# Patient Record
Sex: Female | Born: 1990 | Race: Black or African American | Hispanic: No | Marital: Single | Smoking: Current every day smoker
Health system: Southern US, Community
[De-identification: ages and names within clinical notes are randomized; demographics above are authoritative.]

## PROBLEM LIST (undated history)

## (undated) DIAGNOSIS — D649 Anemia, unspecified: Secondary | ICD-10-CM

## (undated) HISTORY — DX: Anemia, unspecified: D64.9

## (undated) HISTORY — PX: NO PAST SURGERIES: SHX2092

---

## 2019-12-09 NOTE — L&D Delivery Note (Signed)
  Delivery Note Not long after epidural placed, pt felt pressure and was noted to be C/C/+3. After one push, at 10:15 PM a viable female was delivered via Vaginal, Spontaneous (Presentation: Left Occiput Anterior, loose nuchal cord, reduced).  APGAR: 8, 9; weight pending.  After 1 minute, the cord was clamped and cut. 40 units of pitocin diluted in 1000cc LR was infused rapidly IV.  The placenta separated spontaneously and delivered via CCT and maternal pushing effort.  It was inspected and appears to be intact with a 3 VC. Liletta IUD placed (see note).   Anesthesia: Epidural Episiotomy: None Lacerations: None Suture Repair:  Est. Blood Loss (mL): 242  Mom to postpartum.  Baby to Couplet care / Skin to Skin.  Jacklyn Shell 11/29/2020, 10:44 PM

## 2020-06-07 LAB — OB RESULTS CONSOLE HEPATITIS B SURFACE ANTIGEN: Hepatitis B Surface Ag: NEGATIVE

## 2020-06-07 LAB — OB RESULTS CONSOLE PLATELET COUNT: Platelets: 235

## 2020-06-07 LAB — OB RESULTS CONSOLE HGB/HCT, BLOOD
HCT: 32 (ref 29–41)
Hemoglobin: 10.2

## 2020-06-07 LAB — OB RESULTS CONSOLE RPR: RPR: NONREACTIVE

## 2020-06-07 LAB — OB RESULTS CONSOLE HIV ANTIBODY (ROUTINE TESTING): HIV: NONREACTIVE

## 2020-10-29 ENCOUNTER — Inpatient Hospital Stay (HOSPITAL_COMMUNITY)
Admission: AD | Admit: 2020-10-29 | Discharge: 2020-10-29 | Disposition: A | Discharge status: Home / Self Care | Payer: Medicaid Other | Attending: Obstetrics and Gynecology | Admitting: Obstetrics and Gynecology

## 2020-10-29 ENCOUNTER — Other Ambulatory Visit: Payer: Self-pay

## 2020-10-29 ENCOUNTER — Inpatient Hospital Stay (HOSPITAL_BASED_OUTPATIENT_CLINIC_OR_DEPARTMENT_OTHER): Payer: Medicaid Other

## 2020-10-29 ENCOUNTER — Encounter (HOSPITAL_COMMUNITY): Payer: Self-pay | Admitting: Obstetrics and Gynecology

## 2020-10-29 DIAGNOSIS — B373 Candidiasis of vulva and vagina: Secondary | ICD-10-CM | POA: Insufficient documentation

## 2020-10-29 DIAGNOSIS — O99013 Anemia complicating pregnancy, third trimester: Secondary | ICD-10-CM | POA: Diagnosis not present

## 2020-10-29 DIAGNOSIS — F17210 Nicotine dependence, cigarettes, uncomplicated: Secondary | ICD-10-CM | POA: Insufficient documentation

## 2020-10-29 DIAGNOSIS — R1032 Left lower quadrant pain: Secondary | ICD-10-CM | POA: Insufficient documentation

## 2020-10-29 DIAGNOSIS — O26893 Other specified pregnancy related conditions, third trimester: Secondary | ICD-10-CM | POA: Diagnosis not present

## 2020-10-29 DIAGNOSIS — R109 Unspecified abdominal pain: Secondary | ICD-10-CM

## 2020-10-29 DIAGNOSIS — Z3A33 33 weeks gestation of pregnancy: Secondary | ICD-10-CM | POA: Insufficient documentation

## 2020-10-29 DIAGNOSIS — O99333 Smoking (tobacco) complicating pregnancy, third trimester: Secondary | ICD-10-CM | POA: Insufficient documentation

## 2020-10-29 DIAGNOSIS — O479 False labor, unspecified: Secondary | ICD-10-CM

## 2020-10-29 DIAGNOSIS — O26892 Other specified pregnancy related conditions, second trimester: Secondary | ICD-10-CM | POA: Insufficient documentation

## 2020-10-29 DIAGNOSIS — Z3689 Encounter for other specified antenatal screening: Secondary | ICD-10-CM

## 2020-10-29 DIAGNOSIS — O98813 Other maternal infectious and parasitic diseases complicating pregnancy, third trimester: Secondary | ICD-10-CM | POA: Diagnosis not present

## 2020-10-29 DIAGNOSIS — O4703 False labor before 37 completed weeks of gestation, third trimester: Secondary | ICD-10-CM | POA: Insufficient documentation

## 2020-10-29 DIAGNOSIS — D649 Anemia, unspecified: Secondary | ICD-10-CM

## 2020-10-29 DIAGNOSIS — B3731 Acute candidiasis of vulva and vagina: Secondary | ICD-10-CM

## 2020-10-29 LAB — URINALYSIS, ROUTINE W REFLEX MICROSCOPIC
Bilirubin Urine: NEGATIVE
Glucose, UA: NEGATIVE mg/dL
Hgb urine dipstick: NEGATIVE
Ketones, ur: NEGATIVE mg/dL
Nitrite: NEGATIVE
Protein, ur: NEGATIVE mg/dL
Specific Gravity, Urine: 1.017 (ref 1.005–1.030)
pH: 6 (ref 5.0–8.0)

## 2020-10-29 LAB — CBC
HCT: 28.7 % — ABNORMAL LOW (ref 36.0–46.0)
Hemoglobin: 9.1 g/dL — ABNORMAL LOW (ref 12.0–15.0)
MCH: 26.9 pg (ref 26.0–34.0)
MCHC: 31.7 g/dL (ref 30.0–36.0)
MCV: 84.9 fL (ref 80.0–100.0)
Platelets: 193 10*3/uL (ref 150–400)
RBC: 3.38 MIL/uL — ABNORMAL LOW (ref 3.87–5.11)
RDW: 15.9 % — ABNORMAL HIGH (ref 11.5–15.5)
WBC: 5.5 10*3/uL (ref 4.0–10.5)
nRBC: 0 % (ref 0.0–0.2)

## 2020-10-29 LAB — WET PREP, GENITAL
Clue Cells Wet Prep HPF POC: NONE SEEN
Sperm: NONE SEEN
Trich, Wet Prep: NONE SEEN

## 2020-10-29 MED ORDER — TERCONAZOLE 0.4 % VA CREA: 1.0000 | TOPICAL_CREAM | Freq: Every day | VAGINAL | 0 refills | Status: DC

## 2020-10-29 MED ORDER — POLYSACCHARIDE IRON COMPLEX 150 MG PO CAPS: 150.0000 mg | ORAL_CAPSULE | Freq: Every day | ORAL | 0 refills | Status: DC

## 2020-10-29 NOTE — MAU Provider Note (Addendum)
History     CSN: [id number]  Arrival date and time: 10/29/20 1146   First Provider Initiated Contact with Patient 10/29/20 1258      Chief Complaint  Patient presents with   Abdominal Pain   29 y.o. [id number] '@33' .0 wks presenting with LLQ pain. Sx started last night. Describes as sharp and intermittent. Rates pain 5/10. Sitting up helps at times. She has not taken anything for it. Denies urinary sx. Reports yellow, clumpy vaginal discharge with itching. No malodor. She used external Monistat which helped temporarily. She was getting care in [place] until August, none since she moved here. Denies any pregnancy complications. Reports good FM.   OB History    Gravida  7   Para  6   Term  6   Preterm      AB      Living  6     SAB      TAB      Ectopic      Multiple      Live Births  6           History reviewed. No pertinent past medical history.  Past Surgical History:  Procedure Laterality Date   NO PAST SURGERIES      Family History  Adopted: Yes  Problem Relation Age of Onset   Cancer Father     Social History   Tobacco Use   Smoking status: Current Every Day Smoker    Packs/day: 0.25    Types: Cigarettes   Smokeless tobacco: Never Used  Vaping Use   Vaping Use: Never used  Substance Use Topics   Alcohol use: Never   Drug use: Never    Allergies:  Allergies  Allergen Reactions   Aspirin Swelling   Tylenol [Acetaminophen] Swelling    Medications Prior to Admission  Medication Sig Dispense Refill Last Dose   miconazole (MONISTAT 1 COMBINATION PACK) kit Place 1 each vaginally once.   Past Month at Unknown time   Prenatal Vit-Fe Fumarate-FA (MULTIVITAMIN-PRENATAL) 27-0.8 MG TABS tablet Take 1 tablet by mouth daily at 12 noon.   10/28/2020 at Unknown time    Review of Systems  Constitutional: Negative for chills and fever.  Gastrointestinal: Positive for abdominal pain. Negative for nausea and vomiting.  Genitourinary:  Positive for vaginal discharge. Negative for dysuria and vaginal bleeding.  Musculoskeletal: Negative for back pain.   Physical Exam   Blood pressure (!) 117/57, pulse 70, temperature 98.6 F (37 C), temperature source Oral, resp. rate 18, height '5\' 11"'  (1.803 m), weight 71.8 kg, SpO2 100 %.  Physical Exam Vitals and nursing note reviewed. Exam conducted with a chaperone present.  Constitutional:      General: She is not in acute distress.    Appearance: Normal appearance.  HENT:     Head: Normocephalic and atraumatic.  Cardiovascular:     Rate and Rhythm: Normal rate.  Pulmonary:     Effort: Pulmonary effort is normal. No respiratory distress.  Abdominal:     Palpations: Abdomen is soft.     Tenderness: There is no abdominal tenderness.    Genitourinary:    Comments: VE: closed/thick Musculoskeletal:        General: Normal range of motion.     Cervical back: Normal range of motion.  Skin:    General: Skin is warm and dry.  Neurological:     General: No focal deficit present.     Mental Status: She is alert and oriented to person,  place, and time.  Psychiatric:        Mood and Affect: Mood normal.        Behavior: Behavior normal.   EFM: 150 bpm, mod variability, + accels, no decels Toco: rare, UI  Results for orders placed or performed during the hospital encounter of 10/29/20 (from the past 24 hour(s))  Urinalysis, Routine w reflex microscopic Urine, Clean Catch     Status: Abnormal   Collection Time: 10/29/20 12:10 PM  Result Value Ref Range   Color, Urine YELLOW YELLOW   APPearance CLOUDY (A) CLEAR   Specific Gravity, Urine 1.017 1.005 - 1.030   pH 6.0 5.0 - 8.0   Glucose, UA NEGATIVE NEGATIVE mg/dL   Hgb urine dipstick NEGATIVE NEGATIVE   Bilirubin Urine NEGATIVE NEGATIVE   Ketones, ur NEGATIVE NEGATIVE mg/dL   Protein, ur NEGATIVE NEGATIVE mg/dL   Nitrite NEGATIVE NEGATIVE   Leukocytes,Ua LARGE (A) NEGATIVE   RBC / HPF 0-5 0 - 5 RBC/hpf   WBC, UA 6-10 0  - 5 WBC/hpf   Bacteria, UA RARE (A) NONE SEEN   Squamous Epithelial / LPF 21-50 0 - 5   Mucus PRESENT    Hyphae Yeast PRESENT   Wet prep, genital     Status: Abnormal   Collection Time: 10/29/20  1:15 PM   Specimen: Vaginal  Result Value Ref Range   Yeast Wet Prep HPF POC PRESENT (A) NONE SEEN   Trich, Wet Prep NONE SEEN NONE SEEN   Clue Cells Wet Prep HPF POC NONE SEEN NONE SEEN   WBC, Wet Prep HPF POC MANY (A) NONE SEEN   Sperm NONE SEEN   CBC     Status: Abnormal   Collection Time: 10/29/20  1:25 PM  Result Value Ref Range   WBC 5.5 4.0 - 10.5 K/uL   RBC 3.38 (L) 3.87 - 5.11 MIL/uL   Hemoglobin 9.1 (L) 12.0 - 15.0 g/dL   HCT 28.7 (L) 36 - 46 %   MCV 84.9 80.0 - 100.0 fL   MCH 26.9 26.0 - 34.0 pg   MCHC 31.7 30.0 - 36.0 g/dL   RDW 15.9 (H) 11.5 - 15.5 %   Platelets 193 150 - 400 K/uL   nRBC 0.0 0.0 - 0.2 %   MAU Course  Procedures Limited US  MDM Labs ordered and reviewed. Normal limited US. No signs of PTL. Pain is likely MSK or BH ctx. Will treat yeast infection and start Fe for anemia. Plan to arrange NOB first available appt at [facility]. Stable for discharge home.   Assessment and Plan   1. [redacted] weeks gestation of pregnancy   2. Abdominal pain affecting pregnancy   3. NST (non-stress test) reactive   4. Braxton Hicks contractions   5. Anemia during pregnancy in third trimester   6. Yeast vaginitis    Discharge home Follow up at [facility] to start care PTL precautions Rx Terazol Rx Ferrex  Allergies as of 10/29/2020      Reactions   Aspirin Swelling   Tylenol [acetaminophen] Swelling      Medication List    STOP taking these medications   miconazole kit Commonly known as: MONISTAT 1 COMBINATION PACK     TAKE these medications   iron polysaccharides 150 MG capsule Commonly known as: NIFEREX Take 1 capsule (150 mg total) by mouth daily.   multivitamin-prenatal 27-0.8 MG Tabs tablet Take 1 tablet by mouth daily at 12 noon.   terconazole 0.4 %  vaginal cream  Commonly known as: TERAZOL 7 Place 1 applicator vaginally at bedtime.      Julianne Handler, CNM 10/29/2020, 2:42 PM

## 2020-10-29 NOTE — MAU Note (Signed)
Presents with c/o abdominal pain since last night.  Reports pain starts on her left side and shoots around her abdomen.  Denies VB or LOF.  Endorses +FM.  Reports last time seen for [facility] in [name]  was in August.  States EDD 12/17/2020.

## 2020-10-29 NOTE — Discharge Instructions (Signed)
Vaginal Yeast Infection, Adult  Vaginal yeast infection is a condition that causes vaginal discharge as well as soreness, swelling, and redness (inflammation) of the vagina. This is a common condition. Some women get this infection frequently. What are the causes? This condition is caused by a change in the normal balance of the yeast (candida) and bacteria that live in the vagina. This change causes an overgrowth of yeast, which causes the inflammation. What increases the risk? The condition is more likely to develop in women who:  Take antibiotic medicines.  Have diabetes.  Take birth control pills.  Are pregnant.  Douche often.  Have a weak body defense system (immune system).  Have been taking steroid medicines for a long time.  Frequently wear tight clothing. What are the signs or symptoms? Symptoms of this condition include:  White, thick, creamy vaginal discharge.  Swelling, itching, redness, and irritation of the vagina. The lips of the vagina (vulva) may be affected as well.  Pain or a burning feeling while urinating.  Pain during sex. How is this diagnosed? This condition is diagnosed based on:  Your medical history.  A physical exam.  A pelvic exam. Your health care provider will examine a sample of your vaginal discharge under a microscope. Your health care provider may send this sample for testing to confirm the diagnosis. How is this treated? This condition is treated with medicine. Medicines may be over-the-counter or prescription. You may be told to use one or more of the following:  Medicine that is taken by mouth (orally).  Medicine that is applied as a cream (topically).  Medicine that is inserted directly into the vagina (suppository). Follow these instructions at home:  Lifestyle  Do not have sex until your health care provider approves. Tell your sex partner that you have a yeast infection. That person should go to his or her health care  provider and ask if they should also be treated.  Do not wear tight clothes, such as pantyhose or tight pants.  Wear breathable cotton underwear. General instructions  Take or apply over-the-counter and prescription medicines only as told by your health care provider.  Eat more yogurt. This may help to keep your yeast infection from returning.  Do not use tampons until your health care provider approves.  Try taking a sitz bath to help with discomfort. This is a warm water bath that is taken while you are sitting down. The water should only come up to your hips and should cover your buttocks. Do this 3-4 times per day or as told by your health care provider.  Do not douche.  If you have diabetes, keep your blood sugar levels under control.  Keep all follow-up visits as told by your health care provider. This is important. Contact a health care provider if:  You have a fever.  Your symptoms go away and then return.  Your symptoms do not get better with treatment.  Your symptoms get worse.  You have new symptoms.  You develop blisters in or around your vagina.  You have blood coming from your vagina and it is not your menstrual period.  You develop pain in your abdomen. Summary  Vaginal yeast infection is a condition that causes discharge as well as soreness, swelling, and redness (inflammation) of the vagina.  This condition is treated with medicine. Medicines may be over-the-counter or prescription.  Take or apply over-the-counter and prescription medicines only as told by your health care provider.  Do not douche.   Do not have sex or use tampons until your health care provider approves.  Contact a health care provider if your symptoms do not get better with treatment or your symptoms go away and then return. This information is not intended to replace advice given to you by your health care provider. Make sure you discuss any questions you have with your health care  provider. Document Revised: 06/24/2019 Document Reviewed: 04/12/2018 Elsevier Patient Education  2020 [employer].   Pregnancy and Anemia  Anemia is a condition in which the concentration of red blood cells, or hemoglobin, in the blood is below normal. Hemoglobin is a substance in red blood cells that carries oxygen to the tissues of the body. Anemia results when enough oxygen does not reach these tissues. Anemia is common during pregnancy because the woman's body needs more blood volume and blood cells to provide nutrition to the fetus. The fetus needs iron and folic acid as it is developing. Your body may not produce enough red blood cells because of this. Also, during pregnancy, the liquid part of the blood (plasma) increases by about 30-50%, and the red blood cells increase by only 20%. This lowers the concentration of the red blood cells and creates a natural anemia-like situation. What are the causes? The most common cause of anemia during pregnancy is not having enough iron in the body to make red blood cells (iron deficiency anemia). Other causes may include:  Folic acid deficiency.  Vitamin B12 deficiency.  Certain prescription or over-the-counter medicines.  Certain medical conditions or infections that destroy red blood cells.  A low platelet count and bleeding caused by antibodies that go through the placenta to the fetus from the mother's blood. What are the signs or symptoms? Mild anemia may not be noticeable. If it becomes severe, symptoms may include:  Feeling tired (fatigue).  Shortness of breath, especially during activity.  Weakness.  Fainting.  Pale looking skin.  Headaches.  A fast or irregular heartbeat (palpitations).  Dizziness. How is this diagnosed? This condition may be diagnosed based on:  Your medical history and a physical exam.  Blood tests. How is this treated? Treatment for anemia during pregnancy depends on the cause of the anemia.  Treatment can include:  Dietary changes.  Supplements of iron, vitamin B12, or folic acid.  A blood transfusion. This may be needed if anemia is severe.  Hospitalization. This may be needed if there is a lot of blood loss or severe anemia. Follow these instructions at home:  Follow recommendations from your dietitian or health care provider about changing your diet.  Increase your vitamin C intake. This will help the stomach absorb more iron. Some foods that are high in vitamin C include: ? Oranges. ? Peppers. ? Tomatoes. ? Mangoes.  Eat a diet rich in iron. This would include foods such as: ? Liver. ? Beef. ? Eggs. ? Whole grains. ? Spinach. ? Dried fruit.  Take iron and vitamins as told by your health care provider.  Eat green leafy vegetables. These are a good source of folic acid.  Keep all follow-up visits as told by your health care provider. This is important. Contact a health care provider if:  You have frequent or lasting headaches.  You look pale.  You bruise easily. Get help right away if:  You have extreme weakness, shortness of breath, or chest pain.  You become dizzy or have trouble concentrating.  You have heavy vaginal bleeding.  You develop a rash.  You have bloody or black, tarry stools.  You faint.  You vomit up blood.  You vomit repeatedly.  You have abdominal pain.  You have a fever.  You are dehydrated. Summary  Anemia is a condition in which the concentration of red blood cells or hemoglobin in the blood is below normal.  Anemia is common during pregnancy because the woman's body needs more blood volume and blood cells to provide nutrition to the fetus.  The most common cause of anemia during pregnancy is not having enough iron in the body to make red blood cells (iron deficiency anemia).  Mild anemia may not be noticeable. If it becomes severe, symptoms may include feeling tired and weak. This information is not intended  to replace advice given to you by your health care provider. Make sure you discuss any questions you have with your health care provider. Document Revised: 05/10/2019 Document Reviewed: 12/30/2016 Elsevier Patient Education  2020 [employer] Inc[employer]   [employer] of the uterus can occur throughout pregnancy, but they are not always a sign that you are in labor. You may have practice contractions called Braxton Hicks contractions. These false labor contractions are sometimes confused with true labor. What are Deberah Pelton contractions? Braxton Hicks contractions are tightening movements that occur in the muscles of the uterus before labor. Unlike true labor contractions, these contractions do not result in opening (dilation) and thinning of the cervix. Toward the end of pregnancy (32-34 weeks), Braxton Hicks contractions can happen more often and may become stronger. These contractions are sometimes difficult to tell apart from true labor because they can be very uncomfortable. You should not feel embarrassed if you go to the hospital with false labor. Sometimes, the only way to tell if you are in true labor is for your health care provider to look for changes in the cervix. The health care provider will do a physical exam and may monitor your contractions. If you are not in true labor, the exam should show that your cervix is not dilating and your water has not broken. If there are no other health problems associated with your pregnancy, it is completely safe for you to be sent home with false labor. You may continue to have Braxton Hicks contractions until you go into true labor. How to tell the difference between true labor and false labor True labor  Contractions last 30-70 seconds.  Contractions become very regular.  Discomfort is usually felt in the top of the uterus, and it spreads to the lower abdomen and low back.  Contractions do not go away with  walking.  Contractions usually become more intense and increase in frequency.  The cervix dilates and gets thinner. False labor  Contractions are usually shorter and not as strong as true labor contractions.  Contractions are usually irregular.  Contractions are often felt in the front of the lower abdomen and in the groin.  Contractions may go away when you walk around or change positions while lying down.  Contractions get weaker and are shorter-lasting as time goes on.  The cervix usually does not dilate or become thin. Follow these instructions at home:   Take over-the-counter and prescription medicines only as told by your health care provider.  Keep up with your usual exercises and follow other instructions from your health care provider.  Eat and drink lightly if you think you are going into labor.  If Braxton Hicks contractions are making you uncomfortable: ? Change your position from  lying down or resting to walking, or change from walking to resting. ? Sit and rest in a tub of warm water. ? Drink enough fluid to keep your urine pale yellow. Dehydration may cause these contractions. ? Do slow and deep breathing several times an hour.  Keep all follow-up prenatal visits as told by your health care provider. This is important. Contact a health care provider if:  You have a fever.  You have continuous pain in your abdomen. Get help right away if:  Your contractions become stronger, more regular, and closer together.  You have fluid leaking or gushing from your vagina.  You pass blood-tinged mucus (bloody show).  You have bleeding from your vagina.  You have low back pain that you never had before.  You feel your baby's head pushing down and causing pelvic pressure.  Your baby is not moving inside you as much as it used to. Summary  Contractions that occur before labor are called Braxton Hicks contractions, false labor, or practice contractions.  Braxton  Hicks contractions are usually shorter, weaker, farther apart, and less regular than true labor contractions. True labor contractions usually become progressively stronger and regular, and they become more frequent.  Manage discomfort from [facility] contractions by changing position, resting in a warm bath, drinking plenty of water, or practicing deep breathing. This information is not intended to replace advice given to you by your health care provider. Make sure you discuss any questions you have with your health care provider. Document Revised: 11/06/2017 Document Reviewed: 04/09/2017 Elsevier Patient Education  2020 [employer].

## 2020-10-30 LAB — GC/CHLAMYDIA PROBE AMP (~~LOC~~) NOT AT ARMC
Chlamydia: NEGATIVE
Comment: NEGATIVE
Comment: NORMAL
Neisseria Gonorrhea: NEGATIVE

## 2020-10-30 LAB — CULTURE, OB URINE: Culture: <10000 — AB

## 2020-11-14 ENCOUNTER — Ambulatory Visit (INDEPENDENT_AMBULATORY_CARE_PROVIDER_SITE_OTHER): Payer: Medicaid Other | Admitting: Obstetrics

## 2020-11-14 ENCOUNTER — Encounter: Payer: Self-pay | Admitting: Obstetrics

## 2020-11-14 ENCOUNTER — Other Ambulatory Visit: Payer: Self-pay

## 2020-11-14 VITALS — BP 108/65 | HR 98 | Wt 165.0 lb

## 2020-11-14 DIAGNOSIS — Z3480 Encounter for supervision of other normal pregnancy, unspecified trimester: Secondary | ICD-10-CM | POA: Diagnosis not present

## 2020-11-14 DIAGNOSIS — O0930 Supervision of pregnancy with insufficient antenatal care, unspecified trimester: Secondary | ICD-10-CM | POA: Diagnosis not present

## 2020-11-14 DIAGNOSIS — Z3483 Encounter for supervision of other normal pregnancy, third trimester: Secondary | ICD-10-CM | POA: Diagnosis not present

## 2020-11-14 DIAGNOSIS — D508 Other iron deficiency anemias: Secondary | ICD-10-CM | POA: Diagnosis not present

## 2020-11-14 DIAGNOSIS — Z3A35 35 weeks gestation of pregnancy: Secondary | ICD-10-CM | POA: Diagnosis not present

## 2020-11-14 MED ORDER — FERROUS SULFATE 325 (65 FE) MG PO TABS: 325.0000 mg | ORAL_TABLET | Freq: Two times a day (BID) | ORAL | 5 refills | Status: DC

## 2020-11-14 NOTE — Progress Notes (Signed)
Subjective:    Sherry Woodward is being seen today for her first obstetrical visit.  This is not a planned pregnancy. She is at [redacted]w[redacted]d gestation. Her obstetrical history is significant for smoker. Relationship with FOB: significant other, not living together. Patient does intend to breast feed. Pregnancy history fully reviewed.  The information documented in the HPI was reviewed and verified.  Menstrual History: OB History    Gravida  7   Para  6   Term  6   Preterm      AB      Living  6     SAB      TAB      Ectopic      Multiple      Live Births  6            No LMP recorded. Patient is pregnant.    History reviewed. No pertinent past medical history.  Past Surgical History:  Procedure Laterality Date  . NO PAST SURGERIES      (Not in a hospital admission)  Allergies  Allergen Reactions  . Aspirin Swelling  . Tylenol [Acetaminophen] Swelling    Social History   Tobacco Use  . Smoking status: Current Every Day Smoker    Packs/day: 0.25    Types: Cigarettes  . Smokeless tobacco: Never Used  . Tobacco comment: 6 cigs/day  Substance Use Topics  . Alcohol use: Never    Family History  Adopted: Yes  Problem Relation Age of Onset  . Cancer Father      Review of Systems Constitutional: negative for weight loss Gastrointestinal: negative for vomiting Genitourinary:negative for genital lesions and vaginal discharge and dysuria Musculoskeletal:negative for back pain Behavioral/Psych: negative for abusive relationship, depression, illegal drug usage and tobacco use    Objective:    BP 108/65   Pulse 98   Wt 165 lb (74.8 kg)   BMI 23.01 kg/m  General Appearance:    Alert, cooperative, no distress, appears stated age  Head:    Normocephalic, without obvious abnormality, atraumatic  Eyes:    PERRL, conjunctiva/corneas clear, EOM's intact, fundi    benign, both eyes  Ears:    Normal TM's and external ear canals, both ears  Nose:    Nares normal, septum midline, mucosa normal, no drainage    or sinus tenderness  Throat:   Lips, mucosa, and tongue normal; teeth and gums normal  Neck:   Supple, symmetrical, trachea midline, no adenopathy;    thyroid:  no enlargement/tenderness/nodules; no carotid   bruit or JVD  Back:     Symmetric, no curvature, ROM normal, no CVA tenderness  Lungs:     Clear to auscultation bilaterally, respirations unlabored  Chest Wall:    No tenderness or deformity   Heart:    Regular rate and rhythm, S1 and S2 normal, no murmur, rub   or gallop  Breast Exam:    No tenderness, masses, or nipple abnormality  Abdomen:     Soft, non-tender, bowel sounds active all four quadrants,    no masses, no organomegaly  Genitalia:    Normal female without lesion, discharge or tenderness  Extremities:   Extremities normal, atraumatic, no cyanosis or edema  Pulses:   2+ and symmetric all extremities  Skin:   Skin color, texture, turgor normal, no rashes or lesions  Lymph nodes:   Cervical, supraclavicular, and axillary nodes normal  Neurologic:   CNII-XII intact, normal strength, sensation and reflexes  throughout      Lab Review Urine pregnancy test Labs reviewed yes Radiologic studies reviewed no  Assessment:    Pregnancy at [redacted]w[redacted]d weeks    Plan:     1. Supervision of other normal pregnancy, antepartum Rx: - CBC/D/Plt+RPR+Rh+ABO+Rub Ab... - Hemoglobin A1c - [place] MFM OB DETAIL +14 WK; Future - Ambulatory referral to Dentistry - Ferritin  2. Late prenatal care  3. Iron deficiency anemia secondary to inadequate dietary iron intake Rx: - ferrous sulfate 325 (65 FE) MG tablet; Take 1 tablet (325 mg total) by mouth 2 (two) times daily with a meal.  Dispense: 60 tablet; Refill: 5   Prenatal vitamins.  Counseling provided regarding continued use of seat belts, cessation of alcohol consumption, smoking or use of illicit drugs; infection precautions i.e., influenza/TDAP immunizations,  toxoplasmosis,CMV, parvovirus, listeria and varicella; workplace safety, exercise during pregnancy; routine dental care, safe medications, sexual activity, hot tubs, saunas, pools, travel, caffeine use, fish and methlymercury, potential toxins, hair treatments, varicose veins Weight gain recommendations per IOM guidelines reviewed: underweight/BMI< 18.5--> gain 28 - 40 lbs; normal weight/BMI 18.5 - 24.9--> gain 25 - 35 lbs; overweight/BMI 25 - 29.9--> gain 15 - 25 lbs; obese/BMI >30->gain  11 - 20 lbs Problem list reviewed and updated. FIRST/CF mutation testing/NIPT/QUAD SCREEN/fragile X/Ashkenazi Jewish population testing/Spinal muscular atrophy discussed: declined. Role of ultrasound in pregnancy discussed; fetal survey: requested. Amniocentesis discussed: not indicated.   Orders Placed This Encounter  Procedures  . [place] MFM OB DETAIL +14 WK    Standing Status:   Future    Standing Expiration Date:   11/14/2021    Order Specific Question:   Reason for Exam (SYMPTOM  OR DIAGNOSIS REQUIRED)    Answer:   late to care    Order Specific Question:   Preferred Location    Answer:   [facility] Ultrasound  . CBC/D/Plt+RPR+Rh+ABO+Rub Ab...  . Hemoglobin A1c  . Ambulatory referral to Dentistry    Referral Priority:   Routine    Referral Type:   Consultation    Referral Reason:   Specialty Services Required    Requested Specialty:   Dental General Practice    Number of Visits Requested:   1    Follow up in 1 weeks. 50% of 20 min visit spent on counseling and coordination of care.     Brock Bad, MD 11/14/2020 11:16 AM
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
## 2020-11-16 LAB — FERRITIN: Ferritin: 24 ng/mL (ref 15–150)

## 2020-11-16 LAB — CBC/D/PLT+RPR+RH+ABO+RUB AB...
Basophils Absolute: 0 10*3/uL (ref 0.0–0.2)
Basos: 0 %
EOS (ABSOLUTE): 0 10*3/uL (ref 0.0–0.4)
Eos: 1 %
HCV Ab: <0.1 s/co ratio (ref 0.0–0.9)
HIV Screen 4th Generation wRfx: NONREACTIVE
Hematocrit: 32.5 % — ABNORMAL LOW (ref 34.0–46.6)
Hemoglobin: 10.6 g/dL — ABNORMAL LOW (ref 11.1–15.9)
Hepatitis B Surface Ag: NEGATIVE
Immature Grans (Abs): 0 10*3/uL (ref 0.0–0.1)
Immature Granulocytes: 0 %
Lymphocytes Absolute: 1.4 10*3/uL (ref 0.7–3.1)
Lymphs: 23 %
MCH: 27.5 pg (ref 26.6–33.0)
MCHC: 32.6 g/dL (ref 31.5–35.7)
MCV: 84 fL (ref 79–97)
Monocytes Absolute: 0.4 10*3/uL (ref 0.1–0.9)
Monocytes: 7 %
Neutrophils Absolute: 4.3 10*3/uL (ref 1.4–7.0)
Neutrophils: 69 %
Platelets: 172 10*3/uL (ref 150–450)
RBC: 3.85 x10E6/uL (ref 3.77–5.28)
RDW: 18.1 % — ABNORMAL HIGH (ref 11.7–15.4)
RPR Ser Ql: NONREACTIVE
Rh Factor: POSITIVE
Rubella Antibodies, IGG: 2.8 index (ref >0.99)
WBC: 6.1 10*3/uL (ref 3.4–10.8)

## 2020-11-16 LAB — HCV INTERPRETATION

## 2020-11-16 LAB — AB SCR+ANTIBODY ID

## 2020-11-16 LAB — HEMOGLOBIN A1C
Est. average glucose Bld gHb Est-mCnc: 105 mg/dL
Hgb A1c MFr Bld: 5.3 % (ref 4.8–5.6)

## 2020-11-20 ENCOUNTER — Encounter: Payer: Self-pay | Admitting: Advanced Practice Midwife

## 2020-11-20 ENCOUNTER — Ambulatory Visit (INDEPENDENT_AMBULATORY_CARE_PROVIDER_SITE_OTHER): Payer: Medicaid Other | Admitting: Advanced Practice Midwife

## 2020-11-20 ENCOUNTER — Other Ambulatory Visit (HOSPITAL_COMMUNITY)
Admission: RE | Admit: 2020-11-20 | Discharge: 2020-11-20 | Disposition: A | Discharge status: Home / Self Care | Payer: Medicaid Other | Source: Ambulatory Visit | Attending: Advanced Practice Midwife | Admitting: Advanced Practice Midwife

## 2020-11-20 ENCOUNTER — Other Ambulatory Visit: Payer: Self-pay

## 2020-11-20 VITALS — BP 117/69 | HR 75 | Wt 166.0 lb

## 2020-11-20 DIAGNOSIS — O0990 Supervision of high risk pregnancy, unspecified, unspecified trimester: Secondary | ICD-10-CM | POA: Insufficient documentation

## 2020-11-20 DIAGNOSIS — Z3480 Encounter for supervision of other normal pregnancy, unspecified trimester: Secondary | ICD-10-CM | POA: Insufficient documentation

## 2020-11-20 DIAGNOSIS — D508 Other iron deficiency anemias: Secondary | ICD-10-CM | POA: Diagnosis not present

## 2020-11-20 DIAGNOSIS — O0933 Supervision of pregnancy with insufficient antenatal care, third trimester: Secondary | ICD-10-CM | POA: Diagnosis not present

## 2020-11-20 DIAGNOSIS — Z3A36 36 weeks gestation of pregnancy: Secondary | ICD-10-CM | POA: Diagnosis not present

## 2020-11-20 DIAGNOSIS — R102 Pelvic and perineal pain: Secondary | ICD-10-CM

## 2020-11-20 DIAGNOSIS — O0930 Supervision of pregnancy with insufficient antenatal care, unspecified trimester: Secondary | ICD-10-CM | POA: Insufficient documentation

## 2020-11-20 DIAGNOSIS — O26849 Uterine size-date discrepancy, unspecified trimester: Secondary | ICD-10-CM | POA: Diagnosis not present

## 2020-11-20 DIAGNOSIS — O26893 Other specified pregnancy related conditions, third trimester: Secondary | ICD-10-CM

## 2020-11-20 MED ORDER — COMFORT FIT MATERNITY SUPP MED MISC: 1.0000 | Freq: Every day | 0 refills | Status: DC

## 2020-11-20 NOTE — Patient Instructions (Signed)
Things to Try After 37 weeks to Encourage Labor/Get Ready for Labor:   1.  Try the Miles Circuit at www.milescircuit.com daily to improve baby's position and encourage the onset of labor.  2. Walk a little and rest a little every day.  Change positions often.  3. Cervical Ripening: May try one or both a. Red Raspberry Leaf capsules or tea:  two 300mg or 400mg tablets with each meal, 2-3 times a day, or 1-3 cups of tea daily  Potential Side Effects Of Raspberry Leaf:  Most women do not experience any side effects from drinking raspberry leaf tea. However, nausea and loose stools are possible   b. Evening Primrose Oil capsules: take 1 capsule by mouth and place one capsule in the vagina every night.    Some of the potential side effects:  Upset stomach  Loose stools or diarrhea  Headaches  Nausea  4. Sex can also help the cervix ripen and encourage labor onset.    Labor Precautions Reasons to come to MAU at [facility]:  1.  Contractions are  5 minutes apart or less, each last 1 minute, these have been going on for 1-2 hours, and you cannot walk or talk during them 2.  You have a large gush of fluid, or a trickle of fluid that will not stop and you have to wear a pad 3.  You have bleeding that is bright red, heavier than spotting--like menstrual bleeding (spotting can be normal in early labor or after a check of your cervix) 4.  You do not feel the baby moving like he/she normally does 

## 2020-11-20 NOTE — Progress Notes (Signed)
Pt wants to discuss work and labor precautions.
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
## 2020-11-21 LAB — CERVICOVAGINAL ANCILLARY ONLY
Chlamydia: NEGATIVE
Comment: NEGATIVE
Comment: NEGATIVE
Comment: NORMAL
Neisseria Gonorrhea: NEGATIVE
Trichomonas: NEGATIVE

## 2020-11-22 LAB — STREP GP B NAA: Strep Gp B NAA: POSITIVE — AB

## 2020-11-23 ENCOUNTER — Encounter: Payer: Self-pay | Admitting: Advanced Practice Midwife

## 2020-11-23 ENCOUNTER — Inpatient Hospital Stay (HOSPITAL_BASED_OUTPATIENT_CLINIC_OR_DEPARTMENT_OTHER): Payer: Medicaid Other

## 2020-11-23 ENCOUNTER — Ambulatory Visit: Payer: Medicaid Other | Admitting: *Deleted

## 2020-11-23 ENCOUNTER — Other Ambulatory Visit: Payer: Self-pay

## 2020-11-23 ENCOUNTER — Encounter (HOSPITAL_COMMUNITY): Payer: Self-pay | Admitting: Obstetrics and Gynecology

## 2020-11-23 ENCOUNTER — Inpatient Hospital Stay (HOSPITAL_COMMUNITY)
Admission: AD | Admit: 2020-11-23 | Discharge: 2020-11-24 | DRG: 833 | Disposition: A | Discharge status: Home / Self Care | Payer: Medicaid Other | Attending: Obstetrics & Gynecology | Admitting: Obstetrics & Gynecology

## 2020-11-23 ENCOUNTER — Ambulatory Visit (HOSPITAL_BASED_OUTPATIENT_CLINIC_OR_DEPARTMENT_OTHER): Payer: Medicaid Other

## 2020-11-23 ENCOUNTER — Encounter: Payer: Self-pay | Admitting: *Deleted

## 2020-11-23 DIAGNOSIS — Z3480 Encounter for supervision of other normal pregnancy, unspecified trimester: Secondary | ICD-10-CM

## 2020-11-23 DIAGNOSIS — O0930 Supervision of pregnancy with insufficient antenatal care, unspecified trimester: Secondary | ICD-10-CM

## 2020-11-23 DIAGNOSIS — Z3A36 36 weeks gestation of pregnancy: Secondary | ICD-10-CM | POA: Diagnosis not present

## 2020-11-23 DIAGNOSIS — O26843 Uterine size-date discrepancy, third trimester: Secondary | ICD-10-CM

## 2020-11-23 DIAGNOSIS — O0943 Supervision of pregnancy with grand multiparity, third trimester: Secondary | ICD-10-CM

## 2020-11-23 DIAGNOSIS — D508 Other iron deficiency anemias: Secondary | ICD-10-CM

## 2020-11-23 DIAGNOSIS — Z3687 Encounter for antenatal screening for uncertain dates: Secondary | ICD-10-CM

## 2020-11-23 DIAGNOSIS — O365930 Maternal care for other known or suspected poor fetal growth, third trimester, not applicable or unspecified: Secondary | ICD-10-CM | POA: Diagnosis present

## 2020-11-23 DIAGNOSIS — O0933 Supervision of pregnancy with insufficient antenatal care, third trimester: Secondary | ICD-10-CM

## 2020-11-23 DIAGNOSIS — Z9289 Personal history of other medical treatment: Secondary | ICD-10-CM

## 2020-11-23 DIAGNOSIS — O0993 Supervision of high risk pregnancy, unspecified, third trimester: Secondary | ICD-10-CM | POA: Diagnosis not present

## 2020-11-23 DIAGNOSIS — O0990 Supervision of high risk pregnancy, unspecified, unspecified trimester: Secondary | ICD-10-CM

## 2020-11-23 DIAGNOSIS — O368393 Maternal care for abnormalities of the fetal heart rate or rhythm, unspecified trimester, fetus 3: Secondary | ICD-10-CM | POA: Diagnosis not present

## 2020-11-23 DIAGNOSIS — O99820 Streptococcus B carrier state complicating pregnancy: Secondary | ICD-10-CM

## 2020-11-23 DIAGNOSIS — O99413 Diseases of the circulatory system complicating pregnancy, third trimester: Secondary | ICD-10-CM | POA: Insufficient documentation

## 2020-11-23 DIAGNOSIS — Z20822 Contact with and (suspected) exposure to covid-19: Secondary | ICD-10-CM | POA: Diagnosis present

## 2020-11-23 DIAGNOSIS — F17210 Nicotine dependence, cigarettes, uncomplicated: Secondary | ICD-10-CM | POA: Diagnosis present

## 2020-11-23 DIAGNOSIS — O368130 Decreased fetal movements, third trimester, not applicable or unspecified: Principal | ICD-10-CM | POA: Diagnosis present

## 2020-11-23 DIAGNOSIS — I313 Pericardial effusion (noninflammatory): Secondary | ICD-10-CM | POA: Insufficient documentation

## 2020-11-23 DIAGNOSIS — O368390 Maternal care for abnormalities of the fetal heart rate or rhythm, unspecified trimester, not applicable or unspecified: Secondary | ICD-10-CM | POA: Diagnosis present

## 2020-11-23 DIAGNOSIS — O99333 Smoking (tobacco) complicating pregnancy, third trimester: Secondary | ICD-10-CM | POA: Diagnosis present

## 2020-11-23 DIAGNOSIS — Z363 Encounter for antenatal screening for malformations: Secondary | ICD-10-CM

## 2020-11-23 DIAGNOSIS — O289 Unspecified abnormal findings on antenatal screening of mother: Secondary | ICD-10-CM | POA: Diagnosis not present

## 2020-11-23 LAB — CBC
HCT: 34.2 % — ABNORMAL LOW (ref 36.0–46.0)
Hemoglobin: 11.3 g/dL — ABNORMAL LOW (ref 12.0–15.0)
MCH: 28.6 pg (ref 26.0–34.0)
MCHC: 33 g/dL (ref 30.0–36.0)
MCV: 86.6 fL (ref 80.0–100.0)
Platelets: 187 10*3/uL (ref 150–400)
RBC: 3.95 MIL/uL (ref 3.87–5.11)
RDW: 20.5 % — ABNORMAL HIGH (ref 11.5–15.5)
WBC: 5.6 10*3/uL (ref 4.0–10.5)
nRBC: 0 % (ref 0.0–0.2)

## 2020-11-23 LAB — RESP PANEL BY RT-PCR (FLU A&B, COVID) ARPGX2
Influenza A by PCR: NEGATIVE
Influenza B by PCR: NEGATIVE
SARS Coronavirus 2 by RT PCR: NEGATIVE

## 2020-11-23 LAB — TYPE AND SCREEN
ABO/RH(D): B POS
Antibody Screen: NEGATIVE

## 2020-11-23 MED ORDER — ZOLPIDEM TARTRATE 5 MG PO TABS: 5.0000 mg | ORAL_TABLET | Freq: Every evening | ORAL | Status: DC | PRN

## 2020-11-23 MED ORDER — BETAMETHASONE SOD PHOS & ACET 6 (3-3) MG/ML IJ SUSP
12.0000 mg | INTRAMUSCULAR | Status: AC
  Administered 2020-11-23 – 2020-11-24 (×2): 12 mg via INTRAMUSCULAR
  Filled 2020-11-23 (×2): qty 5

## 2020-11-23 MED ORDER — CALCIUM CARBONATE ANTACID 500 MG PO CHEW: 2.0000 | CHEWABLE_TABLET | ORAL | Status: DC | PRN

## 2020-11-23 MED ORDER — PRENATAL MULTIVITAMIN CH
1.0000 | ORAL_TABLET | Freq: Every day | ORAL | Status: DC
  Administered 2020-11-24: 10:00:00 1 via ORAL
  Filled 2020-11-23: qty 1

## 2020-11-23 MED ORDER — BETAMETHASONE SOD PHOS & ACET 6 (3-3) MG/ML IJ SUSP: 12.0000 mg | Freq: Once | INTRAMUSCULAR | Status: DC

## 2020-11-23 MED ORDER — DOCUSATE SODIUM 100 MG PO CAPS
100.0000 mg | ORAL_CAPSULE | Freq: Every day | ORAL | Status: DC
Start: 2020-11-24 — End: 2020-11-24
  Administered 2020-11-24: 10:00:00 100 mg via ORAL
  Filled 2020-11-23: qty 1

## 2020-11-23 NOTE — H&P (Signed)
Sherry Woodward is a 29 y.o. female presenting from MFM for evaluation of decreased fetal movement and BPP 4/8. Patient endorses fetal movement during BPP.  She is a recent transfer form [name] to [facility] with a brief lapse in care. She denies abdominal pain, leaking of fluid, fever or recent illness.  OB History    Gravida  7   Para  6   Term  6   Preterm      AB      Living  6     SAB      IAB      Ectopic      Multiple      Live Births  6          Past Medical History:  Diagnosis Date  . Anemia    Past Surgical History:  Procedure Laterality Date  . NO PAST SURGERIES     Family History: family history includes Cancer in her father. She was adopted. Social History:  reports that she has been smoking cigarettes. She has been smoking about 0.25 packs per day. She has never used smokeless tobacco. She reports that she does not drink alcohol and does not use drugs.     Maternal Diabetes: No Genetic Screening: Declined Maternal Ultrasounds/Referrals: Other:  Serial MFM surveillance. Fetal Ultrasounds or other Referrals:  Referred to Materal Fetal Medicine  Maternal Substance Abuse:  No Significant Maternal Medications:  None Significant Maternal Lab Results:  Group B Strep positive Other Comments:  None  Review of Systems  All other systems reviewed and are negative.    Blood pressure 113/63, pulse 73, temperature 99 F (37.2 C), temperature source Oral, resp. rate 20, SpO2 100 %.   Physical Exam Vitals and nursing note reviewed. Exam conducted with a chaperone present.  Constitutional:      Appearance: Normal appearance.  Cardiovascular:     Rate and Rhythm: Normal rate.     Pulses: Normal pulses.     Heart sounds: Normal heart sounds.  Pulmonary:     Effort: Pulmonary effort is normal.     Breath sounds: Normal breath sounds.  Abdominal:     Comments: Gravid  Skin:    Capillary Refill: Capillary refill takes less than 2 seconds.   Neurological:     Mental Status: She is alert and oriented to person, place, and time.  Psychiatric:        Mood and Affect: Mood normal.        Behavior: Behavior normal.        Thought Content: Thought content normal.        Judgment: Judgment normal.      Prenatal labs: ABO, Rh: B/Positive/-- (12/08 1141) Antibody: Comment, See Final Results (12/08 1141) Rubella: 2.80 (12/08 1141) RPR: Non Reactive (12/08 1141)  HBsAg: Negative (12/08 1141)  HIV: Non Reactive (12/08 1141)  GBS: Positive/-- (12/14 0412)   Assessment/Plan: --29 y.o. [id number] at [redacted]w[redacted]d  --S/p BPP 4/8 this morning --Per Dr. Judeth Cornfield, two hours continuous reactive tracing in MAU then repeat BPP --Repeat BPP 6/8 --Discussed with Dr. Parke Poisson, BMZ and overnight admission advised. Repeat BPP tomorrow --Per Dr. Debroah Loop, admit to [facility], CNM 11/23/2020, 6:09 PM

## 2020-11-23 NOTE — MAU Note (Signed)
Reports sent from office for EFM secondary had U/S and no FM seen.  Denies VB or LOF.  Endorses +FM.

## 2020-11-24 ENCOUNTER — Inpatient Hospital Stay (HOSPITAL_COMMUNITY): Payer: Medicaid Other

## 2020-11-24 DIAGNOSIS — O26843 Uterine size-date discrepancy, third trimester: Secondary | ICD-10-CM

## 2020-11-24 DIAGNOSIS — O0943 Supervision of pregnancy with grand multiparity, third trimester: Secondary | ICD-10-CM

## 2020-11-24 DIAGNOSIS — Z3687 Encounter for antenatal screening for uncertain dates: Secondary | ICD-10-CM

## 2020-11-24 DIAGNOSIS — O99820 Streptococcus B carrier state complicating pregnancy: Secondary | ICD-10-CM

## 2020-11-24 DIAGNOSIS — Z3A36 36 weeks gestation of pregnancy: Secondary | ICD-10-CM

## 2020-11-24 DIAGNOSIS — Z363 Encounter for antenatal screening for malformations: Secondary | ICD-10-CM

## 2020-11-24 DIAGNOSIS — O289 Unspecified abnormal findings on antenatal screening of mother: Secondary | ICD-10-CM

## 2020-11-24 DIAGNOSIS — O368390 Maternal care for abnormalities of the fetal heart rate or rhythm, unspecified trimester, not applicable or unspecified: Secondary | ICD-10-CM | POA: Diagnosis present

## 2020-11-24 DIAGNOSIS — O365930 Maternal care for other known or suspected poor fetal growth, third trimester, not applicable or unspecified: Secondary | ICD-10-CM | POA: Diagnosis present

## 2020-11-24 DIAGNOSIS — O0933 Supervision of pregnancy with insufficient antenatal care, third trimester: Secondary | ICD-10-CM

## 2020-11-24 DIAGNOSIS — O0990 Supervision of high risk pregnancy, unspecified, unspecified trimester: Secondary | ICD-10-CM

## 2020-11-24 DIAGNOSIS — O0993 Supervision of high risk pregnancy, unspecified, third trimester: Secondary | ICD-10-CM

## 2020-11-24 LAB — RPR: RPR Ser Ql: NONREACTIVE

## 2020-11-24 MED ORDER — FERROUS SULFATE 325 (65 FE) MG PO TABS: 325.0000 mg | ORAL_TABLET | ORAL | 5 refills | Status: DC

## 2020-11-24 NOTE — Progress Notes (Signed)
Patient ID: Sherry Woodward, female   DOB: 1991/06/13, 29 y.o.   MRN: [id number] FACULTY PRACTICE ANTEPARTUM(COMPREHENSIVE) NOTE  Sherry Woodward is a 28 y.o. [id number] at [redacted]w[redacted]d by LMP who is admitted for evaluation for BPP 4/8 yesterday.   Fetal presentation is cephalic. Length of Stay:  1  Days  Subjective:  Patient reports the fetal movement as active. Patient reports uterine contraction  activity as none. Patient reports  vaginal bleeding as none. Patient describes fluid per vagina as None.  Vitals:  Blood pressure (!) 104/50, pulse 60, temperature 98.5 F (36.9 C), temperature source Oral, resp. rate 18, SpO2 100 %. Physical Examination:  General appearance - alert, well appearing, and in no distress Heart - normal rate and regular rhythm Abdomen - soft, nontender, nondistended Fundal Height:  size equals dates Cervical Exam: Not evaluated.  Extremities: extremities normal, atraumatic, no cyanosis or edema and Homans sign is negative, no sign of DVT with DTRs 2+ bilaterally Membranes:intact  Fetal Monitoring:  Fetal Heart Rate A   Mode External filed at 11/24/2020 0700  Baseline Rate (A) 125 bpm filed at 11/24/2020 0700  Variability 6-25 BPM filed at 11/24/2020 0700  Accelerations 15 x 15 filed at 11/24/2020 0700  Decelerations None filed at 11/24/2020 0700     Labs:  Results for orders placed or performed during the hospital encounter of 11/23/20 (from the past 24 hour(s))  Type and screen [facility]   Collection Time: 11/23/20  5:45 PM  Result Value Ref Range   ABO/RH(D) B POS    Antibody Screen NEG    Sample Expiration      11/26/2020,2359 Performed at [facility], [address] [place], [place] [address]   CBC on admission   Collection Time: 11/23/20  5:47 PM  Result Value Ref Range   WBC 5.6 4.0 - 10.5 K/uL   RBC 3.95 3.87 - 5.11 MIL/uL   Hemoglobin 11.3 (L) 12.0 - 15.0 g/dL   HCT 41.2 (L) 87.8 - 67.6 %   MCV 86.6 80.0 -  100.0 fL   MCH 28.6 26.0 - 34.0 pg   MCHC 33.0 30.0 - 36.0 g/dL   RDW 72.0 (H) 94.7 - 09.6 %   Platelets 187 150 - 400 K/uL   nRBC 0.0 0.0 - 0.2 %  Resp Panel by RT-PCR (Flu A&B, Covid) Nasopharyngeal Swab   Collection Time: 11/23/20  5:48 PM   Specimen: Nasopharyngeal Swab; Nasopharyngeal(NP) swabs in vial transport medium  Result Value Ref Range   SARS Coronavirus 2 by RT PCR NEGATIVE NEGATIVE   Influenza A by PCR NEGATIVE NEGATIVE   Influenza B by PCR NEGATIVE NEGATIVE     Medications:  Scheduled . betamethasone acetate-betamethasone sodium phosphate  12 mg Intramuscular Q24 Hr x 2  . docusate sodium  100 mg Oral Daily  . prenatal multivitamin  1 tablet Oral Q1200   I have reviewed the patient's current medications.  ASSESSMENT: Patient Active Problem List   Diagnosis Date Noted  . GBS (group B Streptococcus carrier), +RV culture, currently pregnant 11/23/2020  . H/O fetal biophysical profile 11/23/2020  . Supervision of other normal pregnancy, antepartum 11/20/2020  . Limited prenatal care 11/20/2020  . Fetal size inconsistent with dates 11/20/2020    PLAN: Fetal monitoring is reassuring. Repeat BPP per Dr. Parke Poisson. Consider delivery at 37 weeks for IUGR  Sherry Woodward 11/24/2020,8:25 AM

## 2020-11-24 NOTE — Plan of Care (Signed)

## 2020-11-24 NOTE — Discharge Summary (Signed)
Antenatal Physician Discharge Summary  Patient ID: Sherry Woodward MRN: [id number] DOB/AGE: 08/28/1991 29 y.o.  Admit date: 11/23/2020 Discharge date: 11/24/2020  Admission Diagnoses: Principal Problem:   Non-reassuring BPP complicating pregnancy, antepartum Active Problems:   Supervision of high-risk pregnancy   Limited prenatal care   GBS (group B Streptococcus carrier), +RV culture, currently pregnant   H/O fetal biophysical profile   [redacted] weeks gestation of pregnancy   IUGR (intrauterine growth restriction) affecting care of mother, third trimester, not applicable or unspecified fetus  Discharge Diagnoses:  Reassuring BPP 8/10, otherwise the same as above.  Prenatal Procedures: NST and ultrasound for BPP  Consults: None  Hospital Course:  Sherry Woodward is a 29 y.o. [phone] with IUP at [redacted]w[redacted]d admitted after she was noted to have BPP 4/8 during ultrasound, also noted to have EFW 6%, normal fluid and normal umbilical artery dopplers.  She was monitored in MAU and then had BPP 6/8. The plan was for prolonged monitoring and repeat BPP today. Today, she had a reactive NST and BPP 8/10 (-2 for breathing movement), this was reassuring.  No contractions, leaking of fluid and no bleeding.  She received betamethasone x 2 doses.  She had no signs/symptoms of progressing preterm labor or other maternal-fetal concerns. She was deemed stable for discharge to home with outpatient follow up, as recommended by MFM (see recommendations in ultrasounds below).  Discharge Exam: Temp:  [97.5 F (36.4 C)-98.5 F (36.9 C)] 98.2 F (36.8 C) (12/18 1447) Pulse Rate:  [60-74] 74 (12/18 1447) Resp:  [17-18] 18 (12/18 1447) BP: (104-121)/(50-75) 121/63 (12/18 1447) SpO2:  [100 %] 100 % (12/18 1447) Physical Examination: CONSTITUTIONAL: Well-developed, well-nourished female in no acute distress.  HENT:  Normocephalic, atraumatic, External right and left ear normal. Oropharynx is clear and  moist EYES: Conjunctivae and EOM are normal. Pupils are equal, round, and reactive to light. No scleral icterus.  NECK: Normal range of motion, supple, no masses SKIN: Skin is warm and dry. No rash noted. Not diaphoretic. No erythema. No pallor. NEUROLGIC: Alert and oriented to person, place, and time. Normal reflexes, muscle tone coordination. No cranial nerve deficit noted. PSYCHIATRIC: Normal mood and affect. Normal behavior. Normal judgment and thought content. CARDIOVASCULAR: Normal heart rate noted, regular rhythm RESPIRATORY: Effort and breath sounds normal, no problems with respiration noted MUSCULOSKELETAL: Normal range of motion. No edema and no tenderness. 2+ distal pulses. ABDOMEN: Soft, nontender, nondistended, gravid. CERVIX:  Deferred  Fetal monitoring: FHR: 125 bpm, Variability: moderate, Accelerations: Present, Decelerations: Absent  Uterine activity: No contractions  Significant Diagnostic Studies:  Results for orders placed or performed during the hospital encounter of 11/23/20 (from the past 168 hour(s))  Type and screen [facility]   Collection Time: 11/23/20  5:45 PM  Result Value Ref Range   ABO/RH(D) B POS    Antibody Screen NEG    Sample Expiration      11/26/2020,2359 Performed at [facility], [address] [place], [place] [address]   CBC on admission   Collection Time: 11/23/20  5:47 PM  Result Value Ref Range   WBC 5.6 4.0 - 10.5 K/uL   RBC 3.95 3.87 - 5.11 MIL/uL   Hemoglobin 11.3 (L) 12.0 - 15.0 g/dL   HCT 95.6 (L) 21.3 - 08.6 %   MCV 86.6 80.0 - 100.0 fL   MCH 28.6 26.0 - 34.0 pg   MCHC 33.0 30.0 - 36.0 g/dL   RDW 57.8 (H) 46.9 - 62.9 %  Platelets 187 150 - 400 K/uL   nRBC 0.0 0.0 - 0.2 %  RPR   Collection Time: 11/23/20  5:47 PM  Result Value Ref Range   RPR Ser Ql NON REACTIVE NON REACTIVE  Resp Panel by RT-PCR (Flu A&B, Covid) Nasopharyngeal Swab   Collection Time: 11/23/20  5:48 PM   Specimen: Nasopharyngeal  Swab; Nasopharyngeal(NP) swabs in vial transport medium  Result Value Ref Range   SARS Coronavirus 2 by RT PCR NEGATIVE NEGATIVE   Influenza A by PCR NEGATIVE NEGATIVE   Influenza B by PCR NEGATIVE NEGATIVE  Results for orders placed or performed in visit on 11/20/20 (from the past 168 hour(s))  Strep Gp B NAA   Collection Time: 11/20/20  4:12 AM   Specimen: Genital   VR  Result Value Ref Range   Strep Gp B NAA Positive (A) Negative  Cervicovaginal ancillary only( [place])   Collection Time: 11/20/20  4:08 PM  Result Value Ref Range   Trichomonas Negative    Chlamydia Negative    Neisseria Gonorrhea Negative    Comment Normal Reference Range Trichomonas - Negative    Comment Normal Reference [place] Chlamydia - Negative    Comment      Normal Reference Range Neisseria Gonorrhea - Negative   US MFM Fetal BPP Wo Non Stress  Result Date: 11/23/2020 ----------------------------------------------------------------------  OBSTETRICS REPORT                       (Signed Final 11/23/2020 09:20 pm) ---------------------------------------------------------------------- Patient Info  ID #:       [id number]                          D.O.B.:  30-Oct-1991 (29 yrs)  Name:       Sherry Woodward               Visit Date: 11/23/2020 04:01 pm ---------------------------------------------------------------------- Performed By  Attending:        Ma RingsVictor Fang MD         [facility]  Performed By:     Reinaldo RaddleBrenda Shaw            Address:           [address]                    [address]                                                              [address]                                                              [address] [phone]  [place]                                                              [address]  Referred By:      [facility]             [facility]  Ref. Address:     [address]                    [address]                    [place]                    [address] ---------------------------------------------------------------------- Orders  #  Description                           Code        Ordered By  1  [place] MFM FETAL BPP WO NON               09811.91    [facility]     STRESS                                            [place] ----------------------------------------------------------------------  #  Order #                     Accession #                Episode #  1  [id number]                   [id number]                 [id number] ---------------------------------------------------------------------- Indications  Uterine size-date discrepancy, third trimester  O26.843  (S<D)  [redacted] weeks gestation of pregnancy                 Z3A.36  Antenatal screening for malformations           Z36.3  Insufficient Prenatal Care                      O09.30  Encounter for uncertain dates                   Z72.87  Grand multiparity, antepartum                   O09.40 ---------------------------------------------------------------------- Fetal Evaluation  Num Of Fetuses:          1  Fetal Heart Rate(bpm):   144  Cardiac Activity:        Observed  Presentation:            Cephalic  Placenta:                Posterior  Amniotic Fluid  AFI FV:      Within normal limits  AFI Sum(cm)     %Tile       Largest Pocket(cm)  10.83           29          4.13  RUQ(cm)       RLQ(cm)       LUQ(cm)        LLQ(cm)  2.53          2.67          1.5            4.13 ---------------------------------------------------------------------- Biophysical Evaluation  Amniotic F.V:   Pocket => 2 cm             F. Tone:         Observed  F. Movement:    Observed                   Score:           6/8  F. Breathing:   Not Observed ---------------------------------------------------------------------- Biometry  LV:        3.7  mm  ---------------------------------------------------------------------- OB History  Gravidity:    7         Term:   6        Prem:   0        SAB:   0  TOP:          0       Ectopic:  0        Living: 6 ---------------------------------------------------------------------- Gestational Age  LMP:           36w 4d        Date:  03/12/20                 EDD:   12/17/20  Best:          36w 4d     Det. By:  LMP  (03/12/20)          EDD:   12/17/20 ---------------------------------------------------------------------- Anatomy  Diaphragm:             Appears normal         Kidneys:                Appear normal  Stomach:               Appears normal, left                         sided ---------------------------------------------------------------------- Cervix Uterus Adnexa  Cervix  Not visualized (advanced GA >24wks) ---------------------------------------------------------------------- Comments  This patient had a repeat biophysical profile (BPP) as her  earlier BPP was 4/8. She had a reactive NST.  A BPP performed this afternoon was 6 out of 8.  She received  a -2 for fetal breathing movements that did not meet criteria.  There was normal amniotic fluid noted.  Due to IUGR and her abnormal biophysical profiles, she  should be admitted and placed on continuous monitoring.  She should also receive a complete course of antenatal  corticosteroids. She should have  another BPP performed  tomorrow.  I would have a low threshold for delivery should there be any  concerns regarding the fetal status. ----------------------------------------------------------------------                   Ma Rings, MD Electronically Signed Final Report   11/23/2020 09:20 pm ----------------------------------------------------------------------  [place] MFM FETAL BPP WO NON STRESS  Result Date: 11/23/2020 ----------------------------------------------------------------------  OBSTETRICS REPORT                    (Corrected Final 11/23/2020 05:25  pm) ---------------------------------------------------------------------- Patient Info  ID #:       [id number]                          D.O.B.:  [birth year] (29 yrs)  Name:       Sherry Woodward               Visit Date: 11/23/2020 08:29 am ---------------------------------------------------------------------- Performed By  Attending:        Noralee Space MD        Ref. Address:     [address]                                                             [address]                                                             [place]                                                             [address]  Performed By:     Eden Lathe BS      [facility]                                 [facility]  Referred By:      [facility] ---------------------------------------------------------------------- Orders  #  Description                           Code        Ordered By  1  [place] MFM OB DETAIL +14 WK               76811.01    CHARLES HARPER  2  [place] MFM FETAL BPP WO NON               76819.01    CHARLES HARPER     STRESS  3  [place] MFM UA CORD DOPPLER                [phone]    [facility] ----------------------------------------------------------------------  #  Order #                     Accession #                Episode #  1  [id number]                   [id number]                 [id number]  2  [id number]                   [id number]                 [id number]  3  [id number]                   [id number]                 [id number] ---------------------------------------------------------------------- Indications  [redacted] weeks gestation of pregnancy                Z3A.36  Antenatal screening for malformations          Z36.3  Uterine size-date discrepancy, third trimester  O26.843  (S<D)  Insufficient Prenatal Care                     O09.30  Encounter for uncertain dates                  Z55.87  Grand multiparity, antepartum                  O09.40 ---------------------------------------------------------------------- Fetal Evaluation  Num Of Fetuses:         1  Fetal Heart Rate(bpm):  133  Cardiac Activity:       Observed  Presentation:           Cephalic  Placenta:               Posterior  P. Cord Insertion:      Not well visualized  Amniotic Fluid  AFI FV:      Within normal limits  AFI Sum(cm)     %Tile       Largest Pocket(cm)  11.             30          3.6  RUQ(cm)       RLQ(cm)       LUQ(cm)        LLQ(cm)  3             2.1  2.3            3.6 ---------------------------------------------------------------------- Biophysical Evaluation  Amniotic F.V:   Within normal limits       F. Tone:        Not Observed  F. Movement:    Not Observed               Score:          4/8  F. Breathing:   Observed ---------------------------------------------------------------------- Biometry  BPD:      84.1  mm     G. Age:  33w 6d        4.1  %    CI:        74.49   %    70 - 86                                                          FL/HC:      22.4   %    20.8 - 22.6  HC:      309.3  mm     G. Age:  34w 4d        1.5  %    HC/AC:      1.05        0.92 - 1.05  AC:      294.6  mm     G. Age:  33w 3d        1.9  %    FL/BPD:     82.3   %    71 - 87  FL:       69.2  mm     G. Age:  35w 4d         21  %    FL/AC:      23.5   %    20 - 24  HUM:      61.7  mm     G. Age:  35w 5d         51  %  LV:        4.2  mm  Est. FW:    2371  gm      5 lb 4 oz      6  % ---------------------------------------------------------------------- OB History  Gravidity:    7         Term:   6        Prem:   0        SAB:   0  TOP:          0       Ectopic:  0        Living: 6 ---------------------------------------------------------------------- Gestational Age  LMP:           36w 4d        Date:  03/12/20                  EDD:   12/17/20  U/S Today:     34w 3d                                        EDD:   01/01/21  Best:  36w 4d     Det. By:  LMP  (03/12/20)          EDD:   12/17/20 ---------------------------------------------------------------------- Anatomy  Cranium:               Appears normal         LVOT:                   Appears normal  Cavum:                 Appears normal         Ductal Arch:            Not well visualized  Ventricles:            Appears normal         Diaphragm:              Appears normal  Choroid Plexus:        Appears normal         Stomach:                Appears normal, left                                                                        sided  Cerebellum:            Not well visualized    Abdomen:                Appears normal  Posterior Fossa:       Not well visualized    Abdominal Wall:         Not well visualized  Nuchal Fold:           Not applicable (>20    Cord Vessels:           Appears normal ([redacted]                         wks GA)                                        vessel cord)  Face:                  Appears normal         Kidneys:                Appear normal                         (orbits and profile)  Lips:                  Appears normal         Bladder:                Appears normal  Palate:                Not well visualized    Spine:                  Not well visualized  Thoracic:              Appears normal         Upper Extremities:      Visualized  Heart:                 Pericardial            Lower Extremities:      Visualized                         effusion  RVOT:                  Appears normal  Other:  Nasal bone visualized. Technically difficult due to advanced          gestational age. ---------------------------------------------------------------------- Doppler - Fetal Vessels  Umbilical Artery   S/D     %tile      RI    %tile                             ADFV    RDFV   2.49       58     0.6       65                                No      No  ---------------------------------------------------------------------- Cervix Uterus Adnexa  Cervix  Not visualized (advanced GA >24wks)  Uterus  No abnormality visualized.  Right Ovary  Within normal limits.  Left Ovary  Within normal limits.  Cul De Sac  No free fluid seen.  Adnexa  No abnormality visualized. ---------------------------------------------------------------------- Impression  Late prenatal care.  Patient relocated from [place] to  [place].  She had inadequate prenatal care and has not  had ultrasound for dating.  Limited ultrasound study was  performed last month for complaints of abdominal pain.  Patient has not had screening for fetal aneuploidies or for  gestational diabetes.  Patient reports she is very sure of her  LMP date.  Obstetric history significant for 6 previous term deliveries.  On today's ultrasound, fetal biometry is consistent with 34  weeks and 3 days.  Fetal biometry lags gestational age  (established by LMP date) by 2 weeks.  The estimated fetal  weight is at the 6 percentile.  Amniotic fluid is normal.  No  obvious fetal structural defects are seen but anatomical  survey is limited because of advanced gestational age.  Pericardial effusion (measuring 46 mm) is seen.  No obvious  structural heart defect is seen.   Fetal movements and tone did not meet the criteria of  BPP.Umbilical artery Doppler showed normal forward  diastolic flow.  BPP 4/8.  We have not amended her EDD.  As per [facility]  recommendations, fetal biometry should be more than 3  weeks in the third trimester to change EDD.  I explained the finding of fetal growth restriction and abnormal  biophysical profile score.  I explained pericardial effusion and  also reassured her that there are no structural heart defects.  I have recommended that the patient go to the MAU for  prolonged monitoring and repeat BPP in 4 to 6 hours.  We arranged for transport to take the patient to the MAU.  I  discussed with MAU team  ---------------------------------------------------------------------- Recommendations  -NST monitoring for  about 2 hours.  -If patient cannot stay for BPP (and NST reactive), she can  go home and return for BPP either today evening or tomorrow.  -If patient is undelivered, we recommend weekly BPP and  Doppler studies.  -Provided antenatal testing is reassuring and because of her  uncertain dates, delivery should not be before [redacted] weeks  gestation. ----------------------------------------------------------------------                       Noralee Space, MD Electronically Signed Corrected Final Report  11/23/2020 05:25 pm ----------------------------------------------------------------------  [place] MFM OB DETAIL +14 WK  Result Date: 11/23/2020 ----------------------------------------------------------------------  OBSTETRICS REPORT                    (Corrected Final 11/23/2020 05:25 pm) ---------------------------------------------------------------------- Patient Info  ID #:       [id number]                          D.O.B.:  [birth year] (29 yrs)  Name:       Sherry Woodward               Visit Date: 11/23/2020 08:29 am ---------------------------------------------------------------------- Performed By  Attending:        Noralee Space MD        Ref. Address:     [address]                                                             [address]                                                             [place]                                                             [address]  Performed By:     Eden Lathe BS      Location[facility]T                                 [facility]  Referred By:      [facility]  ---------------------------------------------------------------------- Orders  #  Description                           Code        Ordered By  1  [place] MFM OB DETAIL +14 WK               76811.01    CHARLES HARPER  2  [place] MFM FETAL BPP WO NON               76819.01    CHARLES HARPER     STRESS  3  [place] MFM UA CORD DOPPLER                [phone]    [facility] ----------------------------------------------------------------------  #  Order #                     Accession #                Episode #  1  [id number]                   [id number]                 [id number]  2  [id number]                   [id number]                 [id number]  3  [id number]                   [id number]                 [id number] ---------------------------------------------------------------------- Indications  [redacted] weeks gestation of pregnancy                Z3A.36  Antenatal screening for malformations          Z36.3  Uterine size-date discrepancy, third trimester O26.843  (S<D)  Insufficient Prenatal Care                     O09.30  Encounter for uncertain dates                  Z86.87  Grand multiparity, antepartum                  O09.40 ---------------------------------------------------------------------- Fetal Evaluation  Num Of Fetuses:         1  Fetal Heart Rate(bpm):  133  Cardiac Activity:       Observed  Presentation:           Cephalic  Placenta:               Posterior  P. Cord Insertion:      Not well visualized  Amniotic Fluid  AFI FV:      Within normal limits  AFI Sum(cm)     %Tile       Largest Pocket(cm)  11.  30          3.6  RUQ(cm)       RLQ(cm)       LUQ(cm)        LLQ(cm)  3             2.1           2.3            3.6 ---------------------------------------------------------------------- Biophysical Evaluation  Amniotic F.V:   Within normal limits       F. Tone:        Not Observed  F. Movement:    Not Observed               Score:          4/8  F. Breathing:   Observed  ---------------------------------------------------------------------- Biometry  BPD:      84.1  mm     G. Age:  33w 6d        4.1  %    CI:        74.49   %    70 - 86                                                          FL/HC:      22.4   %    20.8 - 22.6  HC:      309.3  mm     G. Age:  34w 4d        1.5  %    HC/AC:      1.05        0.92 - 1.05  AC:      294.6  mm     G. Age:  33w 3d        1.9  %    FL/BPD:     82.3   %    71 - 87  FL:       69.2  mm     G. Age:  35w 4d         21  %    FL/AC:      23.5   %    20 - 24  HUM:      61.7  mm     G. Age:  35w 5d         51  %  LV:        4.2  mm  Est. FW:    2371  gm      5 lb 4 oz      6  % ---------------------------------------------------------------------- OB History  Gravidity:    7         Term:   6        Prem:   0        SAB:   0  TOP:          0       Ectopic:  0        Living: 6 ---------------------------------------------------------------------- Gestational Age  LMP:           36w 4d        Date:  03/12/20                 EDD:   12/17/20  U/S Today:     34w 3d                                        EDD:   01/01/21  Best:          36w 4d     Det. By:  LMP  (03/12/20)          EDD:   12/17/20 ---------------------------------------------------------------------- Anatomy  Cranium:               Appears normal         LVOT:                   Appears normal  Cavum:                 Appears normal         Ductal Arch:            Not well visualized  Ventricles:            Appears normal         Diaphragm:              Appears normal  Choroid Plexus:        Appears normal         Stomach:                Appears normal, left                                                                        sided  Cerebellum:            Not well visualized    Abdomen:                Appears normal  Posterior Fossa:       Not well visualized    Abdominal Wall:         Not well visualized  Nuchal Fold:           Not applicable (>20    Cord Vessels:           Appears  normal ([redacted]                         wks GA)                                        vessel cord)  Face:                  Appears normal         Kidneys:                Appear normal                         (orbits and profile)  Lips:                  Appears normal  Bladder:                Appears normal  Palate:                Not well visualized    Spine:                  Not well visualized  Thoracic:              Appears normal         Upper Extremities:      Visualized  Heart:                 Pericardial            Lower Extremities:      Visualized                         effusion  RVOT:                  Appears normal  Other:  Nasal bone visualized. Technically difficult due to advanced          gestational age. ---------------------------------------------------------------------- Doppler - Fetal Vessels  Umbilical Artery   S/D     %tile      RI    %tile                             ADFV    RDFV   2.49       58     0.6       65                                No      No ---------------------------------------------------------------------- Cervix Uterus Adnexa  Cervix  Not visualized (advanced GA >24wks)  Uterus  No abnormality visualized.  Right Ovary  Within normal limits.  Left Ovary  Within normal limits.  Cul De Sac  No free fluid seen.  Adnexa  No abnormality visualized. ---------------------------------------------------------------------- Impression  Late prenatal care.  Patient relocated from [place] to  [place].  She had inadequate prenatal care and has not  had ultrasound for dating.  Limited ultrasound study was  performed last month for complaints of abdominal pain.  Patient has not had screening for fetal aneuploidies or for  gestational diabetes.  Patient reports she is very sure of her  LMP date.  Obstetric history significant for 6 previous term deliveries.  On today's ultrasound, fetal biometry is consistent with 34  weeks and 3 days.  Fetal biometry lags gestational age   (established by LMP date) by 2 weeks.  The estimated fetal  weight is at the 6 percentile.  Amniotic fluid is normal.  No  obvious fetal structural defects are seen but anatomical  survey is limited because of advanced gestational age.  Pericardial effusion (measuring 46 mm) is seen.  No obvious  structural heart defect is seen.   Fetal movements and tone did not meet the criteria of  BPP.Umbilical artery Doppler showed normal forward  diastolic flow.  BPP 4/8.  We have not amended her EDD.  As per [facility]  recommendations, fetal biometry should be more than 3  weeks in the third trimester to change EDD.  I explained the finding of fetal growth restriction and abnormal  biophysical profile score.  I explained  pericardial effusion and  also reassured her that there are no structural heart defects.  I have recommended that the patient go to the MAU for  prolonged monitoring and repeat BPP in 4 to 6 hours.  We arranged for transport to take the patient to the MAU.  I  discussed with MAU team ---------------------------------------------------------------------- Recommendations  -NST monitoring for about 2 hours.  -If patient cannot stay for BPP (and NST reactive), she can  go home and return for BPP either today evening or tomorrow.  -If patient is undelivered, we recommend weekly BPP and  Doppler studies.  -Provided antenatal testing is reassuring and because of her  uncertain dates, delivery should not be before [redacted] weeks  gestation. ----------------------------------------------------------------------                       Noralee Space, MD Electronically Signed Corrected Final Report  11/23/2020 05:25 pm ----------------------------------------------------------------------  [place] MFM OB LIMITED  Result Date: 10/30/2020 ----------------------------------------------------------------------  OBSTETRICS REPORT                       (Signed Final 10/30/2020 09:23 am)  ---------------------------------------------------------------------- Patient Info  ID #:       [id number]                          D.O.B.:  09/14/1991 (29 yrs)  Name:       Sherry Lias Tague-              Visit Date: 10/29/2020 02:00 pm              Mayford Woodward ---------------------------------------------------------------------- Performed By  Attending:        Lin Landsman      Referred By:      [facility] MAU/Triage                    MD  Performed By:     Percell Boston          [facility] ---------------------------------------------------------------------- Orders  #  Description                           Code        Ordered By  1  [place] MFM OB LIMITED                     92119.41    MELANIE BHAMBRI ----------------------------------------------------------------------  #  Order #                     Accession #                Episode #  1  [id number]                   [id number]                 [id number] ---------------------------------------------------------------------- Indications  [redacted] weeks gestation of pregnancy  Z3A.33  Abdominal pain in pregnancy                    O99.89  Encounter for antenatal screening,             Z36.9  unspecified ---------------------------------------------------------------------- Fetal Evaluation  Num Of Fetuses:         1  Fetal Heart Rate(bpm):  145  Cardiac Activity:       Observed  Presentation:           Cephalic  Placenta:               Posterior  P. Cord Insertion:      Not well visualized  Amniotic Fluid  AFI FV:      Within normal limits  AFI Sum(cm)     %Tile       Largest Pocket(cm)  13.4            43          4.3  RUQ(cm)       RLQ(cm)       LUQ(cm)        LLQ(cm)  2.1           3.4           4.3            3.6 ---------------------------------------------------------------------- OB History  Gravidity:    7         Term:   6        Prem:   0        SAB:   0   TOP:          0       Ectopic:  0        Living: 6 ---------------------------------------------------------------------- Gestational Age  Clinical EDD:  33w 0d                                        EDD:   12/17/20  Best:          33w 0d     Det. By:  Clinical EDD             EDD:   12/17/20 ---------------------------------------------------------------------- Anatomy  Thoracic:              Appears normal         Kidneys:                Appear normal  Stomach:               Appears normal, left   Bladder:                Appears normal                         sided ---------------------------------------------------------------------- Cervix Uterus Adnexa  Cervix  Not visualized (advanced GA >24wks)  Uterus  No abnormality visualized.  Right Ovary  No adnexal mass visualized.  Left Ovary  No adnexal mass visualized.  Cul De Sac  No free fluid seen.  Adnexa  No abnormality visualized. ---------------------------------------------------------------------- Impression  Limited exam due to maternal abdominal pain.  Good fetal movement and amniotic fluid.  Cephalic presentation  No evidence of placenta previa or abruption. ---------------------------------------------------------------------- Recommendations  Management per inpatient provider. ----------------------------------------------------------------------  Lin Landsman, MD Electronically Signed Final Report   10/30/2020 09:23 am ----------------------------------------------------------------------  [place] MFM UA CORD DOPPLER  Result Date: 11/23/2020 ----------------------------------------------------------------------  OBSTETRICS REPORT                    (Corrected Final 11/23/2020 05:25 pm) ---------------------------------------------------------------------- Patient Info  ID #:       [id number]                          D.O.B.:  Jan 03, 1991 (29 yrs)  Name:       Sherry Woodward               Visit Date: 11/23/2020 08:29 am  ---------------------------------------------------------------------- Performed By  Attending:        Noralee Space MD        Ref. Address:     [address]                                                             [address]                                                             [place]                                                             [address]  Performed By:     Eden Lathe BS      [facility]  Referred By:      [facility] ---------------------------------------------------------------------- Orders  #  Description                           Code        Ordered By  1  [place] MFM OB DETAIL +14 WK               76811.01    CHARLES HARPER  2  [place] MFM FETAL BPP WO NON               76819.01    CHARLES HARPER     STRESS  3  [place] MFM UA CORD DOPPLER                [phone]    [facility] ----------------------------------------------------------------------  #  Order #                     Accession #                Episode #  1  [id number]                   [id number]                 [id number]  2  [id number]                   [id number]                 [id number]  3  [id number]                   [id number]                 [id number] ---------------------------------------------------------------------- Indications  [redacted] weeks gestation of pregnancy                Z3A.36  Antenatal screening for malformations          Z36.3  Uterine size-date discrepancy, third trimester O26.843  (S<D)  Insufficient Prenatal Care                     O09.30  Encounter for uncertain dates                  Z52.87  Grand multiparity, antepartum                  O09.40  ---------------------------------------------------------------------- Fetal Evaluation  Num Of Fetuses:         1  Fetal Heart Rate(bpm):  133  Cardiac Activity:       Observed  Presentation:           Cephalic  Placenta:               Posterior  P. Cord Insertion:      Not well visualized  Amniotic Fluid  AFI FV:      Within normal limits  AFI Sum(cm)     %Tile       Largest Pocket(cm)  11.             30          3.6  RUQ(cm)       RLQ(cm)       LUQ(cm)        LLQ(cm)  3             2.1  2.3            3.6 ---------------------------------------------------------------------- Biophysical Evaluation  Amniotic F.V:   Within normal limits       F. Tone:        Not Observed  F. Movement:    Not Observed               Score:          4/8  F. Breathing:   Observed ---------------------------------------------------------------------- Biometry  BPD:      84.1  mm     G. Age:  33w 6d        4.1  %    CI:        74.49   %    70 - 86                                                          FL/HC:      22.4   %    20.8 - 22.6  HC:      309.3  mm     G. Age:  34w 4d        1.5  %    HC/AC:      1.05        0.92 - 1.05  AC:      294.6  mm     G. Age:  33w 3d        1.9  %    FL/BPD:     82.3   %    71 - 87  FL:       69.2  mm     G. Age:  35w 4d         21  %    FL/AC:      23.5   %    20 - 24  HUM:      61.7  mm     G. Age:  35w 5d         51  %  LV:        4.2  mm  Est. FW:    2371  gm      5 lb 4 oz      6  % ---------------------------------------------------------------------- OB History  Gravidity:    7         Term:   6        Prem:   0        SAB:   0  TOP:          0       Ectopic:  0        Living: 6 ---------------------------------------------------------------------- Gestational Age  LMP:           36w 4d        Date:  03/12/20                 EDD:   12/17/20  U/S Today:     34w 3d                                        EDD:   01/01/21  Best:  36w 4d     Det. By:  LMP  (03/12/20)          EDD:    12/17/20 ---------------------------------------------------------------------- Anatomy  Cranium:               Appears normal         LVOT:                   Appears normal  Cavum:                 Appears normal         Ductal Arch:            Not well visualized  Ventricles:            Appears normal         Diaphragm:              Appears normal  Choroid Plexus:        Appears normal         Stomach:                Appears normal, left                                                                        sided  Cerebellum:            Not well visualized    Abdomen:                Appears normal  Posterior Fossa:       Not well visualized    Abdominal Wall:         Not well visualized  Nuchal Fold:           Not applicable (>20    Cord Vessels:           Appears normal ([redacted]                         wks GA)                                        vessel cord)  Face:                  Appears normal         Kidneys:                Appear normal                         (orbits and profile)  Lips:                  Appears normal         Bladder:                Appears normal  Palate:                Not well visualized    Spine:                  Not well visualized  Thoracic:              Appears normal         Upper Extremities:      Visualized  Heart:                 Pericardial            Lower Extremities:      Visualized                         effusion  RVOT:                  Appears normal  Other:  Nasal bone visualized. Technically difficult due to advanced          gestational age. ---------------------------------------------------------------------- Doppler - Fetal Vessels  Umbilical Artery   S/D     %tile      RI    %tile                             ADFV    RDFV   2.49       58     0.6       65                                No      No ---------------------------------------------------------------------- Cervix Uterus Adnexa  Cervix  Not visualized (advanced GA >24wks)  Uterus  No abnormality visualized.   Right Ovary  Within normal limits.  Left Ovary  Within normal limits.  Cul De Sac  No free fluid seen.  Adnexa  No abnormality visualized. ---------------------------------------------------------------------- Impression  Late prenatal care.  Patient relocated from [place] to  [place].  She had inadequate prenatal care and has not  had ultrasound for dating.  Limited ultrasound study was  performed last month for complaints of abdominal pain.  Patient has not had screening for fetal aneuploidies or for  gestational diabetes.  Patient reports she is very sure of her  LMP date.  Obstetric history significant for 6 previous term deliveries.  On today's ultrasound, fetal biometry is consistent with 34  weeks and 3 days.  Fetal biometry lags gestational age  (established by LMP date) by 2 weeks.  The estimated fetal  weight is at the 6 percentile.  Amniotic fluid is normal.  No  obvious fetal structural defects are seen but anatomical  survey is limited because of advanced gestational age.  Pericardial effusion (measuring 46 mm) is seen.  No obvious  structural heart defect is seen.   Fetal movements and tone did not meet the criteria of  BPP.Umbilical artery Doppler showed normal forward  diastolic flow.  BPP 4/8.  We have not amended her EDD.  As per [facility]  recommendations, fetal biometry should be more than 3  weeks in the third trimester to change EDD.  I explained the finding of fetal growth restriction and abnormal  biophysical profile score.  I explained pericardial effusion and  also reassured her that there are no structural heart defects.  I have recommended that the patient go to the MAU for  prolonged monitoring and repeat BPP in 4 to 6 hours.  We arranged for transport to take the patient to the MAU.  I  discussed with MAU team ---------------------------------------------------------------------- Recommendations  -NST monitoring for about  2 hours.  -If patient cannot stay for BPP (and NST  reactive), she can  go home and return for BPP either today evening or tomorrow.  -If patient is undelivered, we recommend weekly BPP and  Doppler studies.  -Provided antenatal testing is reassuring and because of her  uncertain dates, delivery should not be before [redacted] weeks  gestation. ----------------------------------------------------------------------                       Noralee Space, MD Electronically Signed Corrected Final Report  11/23/2020 05:25 pm ----------------------------------------------------------------------   Future Appointments  Date Time Provider Department Center  11/29/2020  8:55 AM Bur[place]on, Brand Males, NP [facility] None    Discharge Condition: Stable  Discharge disposition: 01-Home or Self Care       Discharge Instructions    Discharge activity:  No Restrictions   Complete by: As directed    Discharge diet:  No restrictions   Complete by: As directed    Fetal Kick Count:  Lie on our left side for one hour after a meal, and count the number of times your baby kicks.  If it is less than 5 times, get up, move around and drink some juice.  Repeat the test 30 minutes later.  If it is still less than 5 kicks in an hour, notify your doctor.   Complete by: As directed    No sexual activity restrictions   Complete by: As directed    Notify physician for uterine contractions.  These may be painless and feel like the uterus is tightening or the baby is  "balling up"   Complete by: As directed    PRETERM LABOR:  Includes any of the follwing symptoms that occur between 20 - [redacted] weeks gestation.  If these symptoms are not stopped, preterm labor can result in preterm delivery, placing your baby at risk   Complete by: As directed      Allergies as of 11/24/2020      Reactions   Aspirin Swelling   Tylenol [acetaminophen] Swelling      Medication List    STOP taking these medications   iron polysaccharides 150 MG capsule Commonly known as: NIFEREX   terconazole 0.4 %  vaginal cream Commonly known as: TERAZOL 7     TAKE these medications   Comfort Fit Maternity Supp Med Misc 1 Device by Does not apply route daily.   ferrous sulfate 325 (65 FE) MG tablet Take 1 tablet (325 mg total) by mouth every other day. What changed: when to take this   multivitamin-prenatal 27-0.8 MG Tabs tablet Take 1 tablet by mouth daily at 12 noon.        Total discharge time: 25 minutes   Signed: Jaynie Collins M.D. 11/24/2020, 3:17 PM

## 2020-11-24 NOTE — Discharge Instructions (Signed)
Fetal Movement Counts Patient Name: ________________________________________________ Patient Due Date: ____________________ What is a fetal movement count?  A fetal movement count is the number of times that you feel your baby move during a certain amount of time. This may also be called a fetal kick count. A fetal movement count is recommended for every pregnant woman. You may be asked to start counting fetal movements as early as week 28 of your pregnancy. Pay attention to when your baby is most active. You may notice your baby's sleep and wake cycles. You may also notice things that make your baby move more. You should do a fetal movement count:  When your baby is normally most active.  At the same time each day. A good time to count movements is while you are resting, after having something to eat and drink. How do I count fetal movements? 1. Find a quiet, comfortable area. Sit, or lie down on your side. 2. Write down the date, the start time and stop time, and the number of movements that you felt between those two times. Take this information with you to your health care visits. 3. Write down your start time when you feel the first movement. 4. Count kicks, flutters, swishes, rolls, and jabs. You should feel at least 10 movements. 5. You may stop counting after you have felt 10 movements, or if you have been counting for 2 hours. Write down the stop time. 6. If you do not feel 10 movements in 2 hours, contact your health care provider for further instructions. Your health care provider may want to do additional tests to assess your baby's well-being. Contact a health care provider if:  You feel fewer than 10 movements in 2 hours.  Your baby is not moving like he or she usually does. Date: ____________ Start time: ____________ Stop time: ____________ Movements: ____________ Date: ____________ Start time: ____________ Stop time: ____________ Movements: ____________ Date: ____________  Start time: ____________ Stop time: ____________ Movements: ____________ Date: ____________ Start time: ____________ Stop time: ____________ Movements: ____________ Date: ____________ Start time: ____________ Stop time: ____________ Movements: ____________ Date: ____________ Start time: ____________ Stop time: ____________ Movements: ____________ Date: ____________ Start time: ____________ Stop time: ____________ Movements: ____________ Date: ____________ Start time: ____________ Stop time: ____________ Movements: ____________ Date: ____________ Start time: ____________ Stop time: ____________ Movements: ____________ This information is not intended to replace advice given to you by your health care provider. Make sure you discuss any questions you have with your health care provider. Document Revised: 07/14/2019 Document Reviewed: 07/14/2019 [employer] Patient Education  2020 [employer]  

## 2020-11-26 ENCOUNTER — Other Ambulatory Visit: Payer: Self-pay

## 2020-11-26 ENCOUNTER — Inpatient Hospital Stay (HOSPITAL_COMMUNITY)
Admission: AD | Admit: 2020-11-26 | Discharge: 2020-11-26 | Disposition: A | Discharge status: Home / Self Care | Payer: Medicaid Other | Attending: Family Medicine | Admitting: Family Medicine

## 2020-11-26 DIAGNOSIS — O26893 Other specified pregnancy related conditions, third trimester: Secondary | ICD-10-CM | POA: Diagnosis present

## 2020-11-26 DIAGNOSIS — O99333 Smoking (tobacco) complicating pregnancy, third trimester: Secondary | ICD-10-CM | POA: Insufficient documentation

## 2020-11-26 DIAGNOSIS — Z886 Allergy status to analgesic agent status: Secondary | ICD-10-CM | POA: Insufficient documentation

## 2020-11-26 DIAGNOSIS — L239 Allergic contact dermatitis, unspecified cause: Secondary | ICD-10-CM | POA: Diagnosis not present

## 2020-11-26 DIAGNOSIS — F17210 Nicotine dependence, cigarettes, uncomplicated: Secondary | ICD-10-CM | POA: Diagnosis not present

## 2020-11-26 DIAGNOSIS — O99713 Diseases of the skin and subcutaneous tissue complicating pregnancy, third trimester: Secondary | ICD-10-CM | POA: Insufficient documentation

## 2020-11-26 DIAGNOSIS — Z3A37 37 weeks gestation of pregnancy: Secondary | ICD-10-CM | POA: Diagnosis not present

## 2020-11-26 MED ORDER — LORATADINE 10 MG PO TABS: 10.0000 mg | ORAL_TABLET | Freq: Every day | ORAL | 2 refills | Status: AC | PRN

## 2020-11-26 NOTE — Discharge Instructions (Signed)
Hives Hives are itchy, red, swollen areas on your skin. Hives can show up on any part of your body. Hives often fade within 24 hours (acute hives). New hives can show up after old ones fade. This can go on for many days or weeks (chronic hives). Hives do not spread from person to person (are not contagious). Hives are caused by your body's response to something that you are allergic to (allergen). These are sometimes called triggers. You can get hives right after being around a trigger, or hours later. What are the causes?  Allergies to foods.  Insect bites or stings.  Pollen.  Pets.  Latex.  Chemicals.  Spending time in sunlight, heat, or cold.  Exercise.  Stress.  Some medicines.  Viruses. This includes the common cold.  Infections caused by germs (bacteria).  Allergy shots.  Blood transfusions. Sometimes, the cause is not known. What increases the risk?  Being a woman.  Being allergic to foods such as: ? Citrus fruits. ? Milk. ? Eggs. ? Peanuts. ? Tree nuts. ? Shellfish.  Being allergic to: ? Medicines. ? Latex. ? Insects. ? Animals. ? Pollen. What are the signs or symptoms?   Raised, itchy, red or white bumps or patches on your skin. These areas may: ? Get large and swollen. ? Change in shape and location. ? Stand alone or connect to each other over a large area of skin. ? Sting or hurt. ? Turn white when pressed in the center (blanch). In very bad cases, your hands, feet, and face may also get swollen. This may happen if hives start deeper in your skin. How is this treated? Treatment for this condition depends on your symptoms. Treatment may include:  Using cool, wet cloths (cool compresses) or taking cool showers to stop the itching.  Medicines that help: ? Relieve itching (antihistamines). ? Reduce swelling (corticosteroids). ? Treat infection (antibiotics).  A medicine (omalizumab) that is given as a shot (injection). Your doctor may  prescribe this if you have hives that do not get better even after other treatments.  In very bad cases, you may need a shot of a medicine called epinephrineto prevent a life-threatening allergic reaction (anaphylaxis). Follow these instructions at home: Medicines  Take or apply over-the-counter and prescription medicines only as told by your doctor.  If you were prescribed an antibiotic medicine, use it as told by your doctor. Do not stop using it even if you start to feel better. Skin care  Apply cool, wet cloths to the hives.  Do not scratch your skin. Do not rub your skin. General instructions  Do not take hot showers or baths. This can make itching worse.  Do not wear tight clothes.  Use sunscreen and wear clothes that cover your skin when you are outside.  Avoid any triggers that cause your hives. Keep a journal to help track what causes your hives. Write down: ? What medicines you take. ? What you eat and drink. ? What products you use on your skin.  Keep all follow-up visits as told by your doctor. This is important. Contact a doctor if:  Your symptoms are not better with medicine.  Your joints hurt or are swollen. Get help right away if:  You have a fever.  You have pain in your belly (abdomen).  Your tongue or lips are swollen.  Your eyelids are swollen.  Your chest or throat feels tight.  You have trouble breathing or swallowing. These symptoms may be an emergency.   Do not wait to see if the symptoms will go away. Get medical help right away. Call your local emergency services (911 in the [place] Do not drive yourself to the hospital. Summary  Hives are itchy, red, swollen areas on your skin.  Treatment for this condition depends on your symptoms.  Avoid things that cause your hives. Keep a journal to help track what causes your hives.  Take and apply over-the-counter and prescription medicines only as told by your doctor.  Keep all follow-up visits  as told by your doctor. This is important. This information is not intended to replace advice given to you by your health care provider. Make sure you discuss any questions you have with your health care provider. Document Revised: 06/09/2018 Document Reviewed: 06/09/2018 Elsevier Patient Education  [employer]. It sounds like you are having hives. I have prescribed Claritin for this, which you can take once a day. Please f/u with your doctor if worsens. Return if difficulty breathing, tongue or throat swelling.

## 2020-11-26 NOTE — MAU Provider Note (Signed)
History    Chief Complaint  Patient presents with  . Rash   HPI [id number] at 37 weeks today. Seen for intermittent, itchy rash on thighs bilaterally that started after BMZ injection since admission a few weeks. Described as raised areas. No palliating or provoking factors. Hasn't tried anything, due to resources. Not currently there, but having some tingling.  OB History    Gravida  7   Para  6   Term  6   Preterm      AB      Living  6     SAB      IAB      Ectopic      Multiple      Live Births  6           Past Medical History:  Diagnosis Date  . Anemia     Past Surgical History:  Procedure Laterality Date  . NO PAST SURGERIES      Family History  Adopted: Yes  Problem Relation Age of Onset  . Cancer Father     Social History   Tobacco Use  . Smoking status: Current Every Day Smoker    Packs/day: 0.25    Types: Cigarettes  . Smokeless tobacco: Never Used  . Tobacco comment: 6 cigs/day  Vaping Use  . Vaping Use: Never used  Substance Use Topics  . Alcohol use: Never  . Drug use: Never    Allergies:  Allergies  Allergen Reactions  . Aspirin Swelling  . Tylenol [Acetaminophen] Swelling    Medications Prior to Admission  Medication Sig Dispense Refill Last Dose  . Elastic Bandages & Supports (COMFORT FIT MATERNITY SUPP MED) MISC 1 Device by Does not apply route daily. 1 each 0   . ferrous sulfate 325 (65 FE) MG tablet Take 1 tablet (325 mg total) by mouth every other day. 60 tablet 5   . Prenatal Vit-Fe Fumarate-FA (MULTIVITAMIN-PRENATAL) 27-0.8 MG TABS tablet Take 1 tablet by mouth daily at 12 noon.       Review of Systems Physical Exam Blood pressure 136/73, pulse 84, temperature 98.7 F (37.1 C), temperature source Oral, resp. rate 17, height 5\' 11"  (1.803 m), weight 76.3 kg, SpO2 100 %. Physical Exam Vitals reviewed.  Constitutional:      Appearance: Normal appearance.  Cardiovascular:     Rate and Rhythm: Normal rate and  regular rhythm.  Pulmonary:     Effort: Pulmonary effort is normal.  Skin:    General: Skin is warm.     Capillary Refill: Capillary refill takes less than 2 seconds.  Neurological:     Mental Status: She is alert.  Psychiatric:        Mood and Affect: Mood normal.        Behavior: Behavior normal.        Thought Content: Thought content normal.        Judgment: Judgment normal.     MAU Course Procedures  MDM  A/P   ICD-10-CM   1. [redacted] weeks gestation of pregnancy  Z3A.37   2. Allergic dermatitis  L23.9    Claritin 10mg  PRN F/u with primary OB.  , DO 11/26/2020 10:45 AM

## 2020-11-26 NOTE — MAU Note (Signed)
Has been having hives, all over body, since left the hospital- comes and goes.  Itches. Currently does not have any spots. starting to feel tingly in her legs- so thinks it is going to start again. Did not take Benadryl, doesn't have resources.   Received Betamethasone injection while she was admitted, thinks it was from that..  Supposed to work today, but thought she needed to be seen.

## 2020-11-29 ENCOUNTER — Other Ambulatory Visit: Payer: Self-pay

## 2020-11-29 ENCOUNTER — Inpatient Hospital Stay (HOSPITAL_COMMUNITY): Payer: Medicaid Other | Admitting: Anesthesiology

## 2020-11-29 ENCOUNTER — Ambulatory Visit (INDEPENDENT_AMBULATORY_CARE_PROVIDER_SITE_OTHER): Payer: Medicaid Other | Admitting: Nurse Practitioner

## 2020-11-29 ENCOUNTER — Other Ambulatory Visit: Payer: Medicaid Other

## 2020-11-29 ENCOUNTER — Encounter (HOSPITAL_COMMUNITY): Payer: Self-pay | Admitting: Obstetrics & Gynecology

## 2020-11-29 ENCOUNTER — Inpatient Hospital Stay (HOSPITAL_COMMUNITY)
Admission: AD | Admit: 2020-11-29 | Discharge: 2020-12-01 | DRG: 807 | Disposition: A | Discharge status: Home / Self Care | Payer: Medicaid Other | Attending: Obstetrics & Gynecology | Admitting: Obstetrics & Gynecology

## 2020-11-29 ENCOUNTER — Other Ambulatory Visit: Payer: Self-pay | Admitting: Obstetrics and Gynecology

## 2020-11-29 VITALS — BP 121/68 | HR 87 | Wt 168.0 lb

## 2020-11-29 DIAGNOSIS — O0993 Supervision of high risk pregnancy, unspecified, third trimester: Secondary | ICD-10-CM | POA: Diagnosis not present

## 2020-11-29 DIAGNOSIS — O99820 Streptococcus B carrier state complicating pregnancy: Secondary | ICD-10-CM

## 2020-11-29 DIAGNOSIS — O26849 Uterine size-date discrepancy, unspecified trimester: Secondary | ICD-10-CM | POA: Diagnosis present

## 2020-11-29 DIAGNOSIS — Z30014 Encounter for initial prescription of intrauterine contraceptive device: Secondary | ICD-10-CM

## 2020-11-29 DIAGNOSIS — F17210 Nicotine dependence, cigarettes, uncomplicated: Secondary | ICD-10-CM | POA: Diagnosis present

## 2020-11-29 DIAGNOSIS — O365930 Maternal care for other known or suspected poor fetal growth, third trimester, not applicable or unspecified: Secondary | ICD-10-CM

## 2020-11-29 DIAGNOSIS — O99824 Streptococcus B carrier state complicating childbirth: Secondary | ICD-10-CM | POA: Diagnosis present

## 2020-11-29 DIAGNOSIS — Z3A37 37 weeks gestation of pregnancy: Secondary | ICD-10-CM

## 2020-11-29 DIAGNOSIS — O99334 Smoking (tobacco) complicating childbirth: Secondary | ICD-10-CM | POA: Diagnosis present

## 2020-11-29 DIAGNOSIS — O0930 Supervision of pregnancy with insufficient antenatal care, unspecified trimester: Secondary | ICD-10-CM

## 2020-11-29 DIAGNOSIS — Z30430 Encounter for insertion of intrauterine contraceptive device: Secondary | ICD-10-CM | POA: Diagnosis not present

## 2020-11-29 DIAGNOSIS — O368390 Maternal care for abnormalities of the fetal heart rate or rhythm, unspecified trimester, not applicable or unspecified: Secondary | ICD-10-CM | POA: Diagnosis present

## 2020-11-29 DIAGNOSIS — Z975 Presence of (intrauterine) contraceptive device: Secondary | ICD-10-CM

## 2020-11-29 DIAGNOSIS — Z8759 Personal history of other complications of pregnancy, childbirth and the puerperium: Secondary | ICD-10-CM | POA: Diagnosis not present

## 2020-11-29 DIAGNOSIS — O0990 Supervision of high risk pregnancy, unspecified, unspecified trimester: Secondary | ICD-10-CM

## 2020-11-29 LAB — CBC
HCT: 38.6 % (ref 36.0–46.0)
Hemoglobin: 12 g/dL (ref 12.0–15.0)
MCH: 27.6 pg (ref 26.0–34.0)
MCHC: 31.1 g/dL (ref 30.0–36.0)
MCV: 88.9 fL (ref 80.0–100.0)
Platelets: 226 10*3/uL (ref 150–400)
RBC: 4.34 MIL/uL (ref 3.87–5.11)
RDW: 20.2 % — ABNORMAL HIGH (ref 11.5–15.5)
WBC: 6.6 10*3/uL (ref 4.0–10.5)
nRBC: 0 % (ref 0.0–0.2)

## 2020-11-29 LAB — TYPE AND SCREEN
ABO/RH(D): B POS
Antibody Screen: NEGATIVE

## 2020-11-29 MED ORDER — FENTANYL-BUPIVACAINE-NACL 0.5-0.125-0.9 MG/250ML-% EP SOLN
12.0000 mL/h | EPIDURAL | Status: DC | PRN
  Filled 2020-11-29: qty 250

## 2020-11-29 MED ORDER — MISOPROSTOL 50MCG HALF TABLET
ORAL_TABLET | ORAL | Status: AC
  Filled 2020-11-29: qty 1

## 2020-11-29 MED ORDER — LACTATED RINGERS IV SOLN: 500.0000 mL | Freq: Once | INTRAVENOUS | Status: DC

## 2020-11-29 MED ORDER — EPHEDRINE 5 MG/ML INJ: 10.0000 mg | INTRAVENOUS | Status: DC | PRN

## 2020-11-29 MED ORDER — MISOPROSTOL 50MCG HALF TABLET
50.0000 ug | ORAL_TABLET | ORAL | Status: DC | PRN
  Administered 2020-11-29: 13:00:00 50 ug via BUCCAL

## 2020-11-29 MED ORDER — OXYCODONE-ACETAMINOPHEN 5-325 MG PO TABS: 2.0000 | ORAL_TABLET | ORAL | Status: DC | PRN

## 2020-11-29 MED ORDER — ACETAMINOPHEN 325 MG PO TABS: 650.0000 mg | ORAL_TABLET | ORAL | Status: DC | PRN

## 2020-11-29 MED ORDER — PHENYLEPHRINE 40 MCG/ML (10ML) SYRINGE FOR IV PUSH (FOR BLOOD PRESSURE SUPPORT): 80.0000 ug | PREFILLED_SYRINGE | INTRAVENOUS | Status: DC | PRN

## 2020-11-29 MED ORDER — LACTATED RINGERS IV SOLN: 500.0000 mL | INTRAVENOUS | Status: DC | PRN

## 2020-11-29 MED ORDER — TERBUTALINE SULFATE 1 MG/ML IJ SOLN: 0.2500 mg | Freq: Once | INTRAMUSCULAR | Status: DC | PRN

## 2020-11-29 MED ORDER — DIPHENHYDRAMINE HCL 50 MG/ML IJ SOLN
12.5000 mg | INTRAMUSCULAR | Status: DC | PRN
Start: 2020-11-29 — End: 2020-11-30

## 2020-11-29 MED ORDER — LIDOCAINE HCL (PF) 1 % IJ SOLN: 30.0000 mL | INTRAMUSCULAR | Status: DC | PRN

## 2020-11-29 MED ORDER — LIDOCAINE HCL (PF) 1 % IJ SOLN
INTRAMUSCULAR | Status: DC | PRN
  Administered 2020-11-29: 5 mL via EPIDURAL
  Administered 2020-11-29: 4 mL via EPIDURAL

## 2020-11-29 MED ORDER — OXYTOCIN-SODIUM CHLORIDE 30-0.9 UT/500ML-% IV SOLN
1.0000 m[IU]/min | INTRAVENOUS | Status: DC
  Administered 2020-11-29: 18:00:00 2 m[IU]/min via INTRAVENOUS
  Filled 2020-11-29: qty 500

## 2020-11-29 MED ORDER — ONDANSETRON HCL 4 MG/2ML IJ SOLN: 4.0000 mg | Freq: Four times a day (QID) | INTRAMUSCULAR | Status: DC | PRN

## 2020-11-29 MED ORDER — LACTATED RINGERS IV SOLN: INTRAVENOUS | Status: DC

## 2020-11-29 MED ORDER — OXYCODONE-ACETAMINOPHEN 5-325 MG PO TABS: 1.0000 | ORAL_TABLET | ORAL | Status: DC | PRN

## 2020-11-29 MED ORDER — PENICILLIN G POT IN DEXTROSE 60000 UNIT/ML IV SOLN
3.0000 10*6.[IU] | INTRAVENOUS | Status: DC
  Administered 2020-11-29: 19:00:00 3 10*6.[IU] via INTRAVENOUS
  Filled 2020-11-29: qty 50

## 2020-11-29 MED ORDER — SOD CITRATE-CITRIC ACID 500-334 MG/5ML PO SOLN: 30.0000 mL | ORAL | Status: DC | PRN

## 2020-11-29 MED ORDER — OXYTOCIN-SODIUM CHLORIDE 30-0.9 UT/500ML-% IV SOLN: 2.5000 [IU]/h | INTRAVENOUS | Status: DC

## 2020-11-29 MED ORDER — SODIUM CHLORIDE 0.9 % IV SOLN
5.0000 10*6.[IU] | Freq: Once | INTRAVENOUS | Status: AC
  Administered 2020-11-29: 15:00:00 5 10*6.[IU] via INTRAVENOUS
  Filled 2020-11-29: qty 5

## 2020-11-29 MED ORDER — SODIUM CHLORIDE (PF) 0.9 % IJ SOLN
INTRAMUSCULAR | Status: DC | PRN
  Administered 2020-11-29: 12 mL/h via EPIDURAL

## 2020-11-29 MED ORDER — OXYTOCIN BOLUS FROM INFUSION
333.0000 mL | Freq: Once | INTRAVENOUS | Status: AC
  Administered 2020-11-29: 22:00:00 333 mL via INTRAVENOUS

## 2020-11-29 MED ORDER — FENTANYL CITRATE (PF) 100 MCG/2ML IJ SOLN: 50.0000 ug | INTRAMUSCULAR | Status: DC | PRN

## 2020-11-29 MED ORDER — LEVONORGESTREL 19.5 MCG/DAY IU IUD
INTRAUTERINE_SYSTEM | Freq: Once | INTRAUTERINE | Status: AC
  Administered 2020-11-29: 22:00:00 1 via INTRAUTERINE
  Filled 2020-11-29: qty 1

## 2020-11-29 NOTE — Patient Instructions (Signed)
AREA PEDIATRIC/FAMILY PRACTICE PHYSICIANS  [place] ([address]) . [facility] o Chambliss, MD; Eniola, MD; Hale, MD; Hensel, MD; McDiarmid, MD; McIntyer, MD; Neal, MD; Walden, MD o [address] [place], [place] [address] o [phone] o Mon-Fri 8:30-12:30, 1:30-5:00 o Providers come to see babies at [facility] o Accepting Medicaid . [facility] o Limited providers who accept newborns: Koirala, MD; Morrow, MD; Wolters, MD o [address], [place], [place] [address] o [phone] o Mon-Fri 8:00-5:30 o Babies seen by providers at [facility] o Does NOT accept Medicaid o Please call early in hospitalization for appointment (limited availability)  . [facility] o Mulberry, MD o [address] [place], [place] [address] o [phone] o Mon, Tue, Thur, Fri 8:30-5:00, Wed 10:00-7:00 (closed 1-2pm) o Babies seen by [facility] providers o Accepting Medicaid . [facility] o Rubin, MD o [address], [place], [place] [address] o [phone] o Mon-Fri 8:30-5:00, Sat 8:30-12:00 o Provider comes to see babies at [facility] o Accepting Medicaid o Must have been referred from current patients or contacted office prior to delivery . [facility] ([facility]) o Brown, MD; Chandler, MD; Ettefagh, MD; Grant, MD; Lester, MD; McCormick, MD; McQueen, MD; Prose, MD; Simha, MD; Stanley, MD; Stryffeler, NP; Tebben, NP o [address], [place], [place] [address] o [phone] o Mon, Tue, Thur, Fri 8:30-5:30, Wed 9:30-5:30, Sat 8:30-12:30 o Babies seen by [facility] providers o Accepting Medicaid o Only accepting infants of first-time parents or siblings of current patients o Hospital discharge coordinator will make follow-up appointment . Jack Amos o [address],  [place], [place]  [address] o [phone]   Fax - [phone] . [facility] o [address], [place], [place]  [address] o Phone - [phone]   Fax - [phone] . Shilpa Gosrani o [address], [place], [place]  [address] o [phone]  [place] ([address]) .  [facility] o Bates, MD; [place], MD; Cooper, Cox, MD; MD; Davis, MD; Dovico, MD; Ettefaugh, MD; Little, MD; Lowe, MD; Keiffer, MD; Melvin, MD; Sumner, MD; Williams, MD o [address], [place], [place] [address] o [phone] o Mon-Fri 8:30-5:00 (extended evenings Mon-Thur as needed), Sat-Sun 10:00-1:00 o Providers come to see babies at [facility] o Accepting Medicaid for families of first-time babies and families with all children in the household age 3 and under. Must register with office prior to making appointment (M-F only). . [facility] o Henson, NP; Knapp, MD; Lalonde, MD; Tysinger, PA o [address] [place], [place] [address] o [phone] o Mon-Fri 8:00-5:00 o Babies seen by providers at [facility] o Does NOT accept Medicaid/Commercial Insurance Only . [facility] ([facility])  o Artis, MD; Barnes, MD; Bratton, MD; Coccaro, MD; [place], MD; Kramer, MD; Marshall, MD; Netherton, MD; Poleto, MD; Skinner, MD o [address] [place], [place] [address] o [phone] o Mon-Fri 8:30-5:30, Sat (Oct.-Mar.) 9:00-1:00 o Babies seen by providers at [facility] o Accepting Medicaid  [place] ([address]) . [facility] o Reid, MD; Warner, MD o [address], [place], [place] [address] o [phone] o Mon-Fri 8:30-5:00, Sat 8:30-12:00 o Providers come to see babies at [facility] o Does NOT accept Medicaid . [facility] o Becker, PA; Hagler, MD; Scifres, PA; Sun, MD; Swayne, MD o [address],  [place], [place] [address] o [phone] o Mon-Fri 8:00-5:00 o Babies seen by providers at [facility] o Does NOT accept Medicaid o Only accepting babies of parents who are patients o Please call early in hospitalization for appointment (limited availability) . [facility] o Clark, MD; Frye, MD; Kelleher, MD; Mack, NP; Miller, MD; O'Keller, MD; Patterson, NP; Pudlo, MD; Puzio, MD; Thomas, MD; Tucker, MD; Twiselton, MD o [address], [place], [place] [address] o [phone] o Mon-Fri 8:00-5:00, Sat 9:00-12:00 o Providers come to see babies at [facility] o Does NOT accept Medicaid  [place] ([address]) . [facility] o Limited providers accepting new patients: Brake, NP; Wharton, PA o [address], [place], [place] [address] o [phone] o Mon-Fri 8:00-5:00 o Babies seen by providers at [facility] o Does NOT accept Medicaid o Only accepting babies of parents who are patients o Please call early in hospitalization for appointment (limited availability) . [facility] o Gay, MD; Quinlan, MD o [address] [place], [place] [address] o [phone] (press 1 to schedule appointment) o Mon-Fri 8:00-5:00 o Providers come to see babies at [facility] o Does NOT accept Medicaid . [facility] o Mazer, MD o [address] [place], [place] [address] o [phone] o Mon-Fri 8:30-5:00 (lunch 12:30-1:00), extended hours by appointment only Wed 5:00-6:30 o Babies seen by [facility] providers o Accepting Medicaid . [facility] o Banks, MD; Jordan, MD; Koberlein, MD o [address], [place], [place] [address] o [phone] o Mon-Fri 8:00-5:00 o Babies seen by [facility] providers o Does NOT accept Medicaid . [facility] o Parker, MD; Hunter, MD; Wallace, DO o [address] [place], [place]  [address] o [phone] o Mon-Fri 8:00-5:00 o Babies seen by [facility] providers o Does NOT accept Medicaid . [facility] o Brandon, PA; Brecken, PA; Christy, NP; Dees, MD; DeClaire, MD; DeWeese, MD; Hansen, NP; Mills, NP; Parrish, NP; Smoot, NP; Summer, MD; Vapne, MD o [address] [place], [place] [address] o [phone] o Mon-Fri 8:30-5:00, Sat 10:00-1:00 o Providers come to see babies at [facility] o Does NOT accept Medicaid o Free prenatal information session Tuesdays at 4:45pm . [facility] o Bouska, MD; Gordon, PA; Jeffery, PA; Weber, PA o [address] [place] [address] o [phone] o Mon-Fri 7:30-5:30 o Babies seen by [facility] providers . [facility] o [address], [place], [place]  [address] o [phone]   Fax - [phone]  [place] ([address] & [address]) . [facility] o [place]ese, MD o [address] [place], [place] [address] o [phone] o Mon-Thur 8:00-6:00 o Providers come to see babies at [facility] o Accepting Medicaid . [facility] o Anderson, NP; Badger, MD; Beal, PA; Spencer, PA o [address] [place], [place] [address] o [phone] o Mon-Thur 7:30-7:30, Fri 7:30-4:30 o Babies seen by [facility] providers o Accepting Medicaid . [facility] o Agbuya, MD; Klett, NP; Romgoolam, MD o [address], [place], [place] [address] o [phone] o Mon-Fri 8:30-5:00, Sat 8:30-12:00 o Providers come to see babies at [facility] o Accepting Medicaid o Must have "Meet & Greet" appointment at office prior to delivery . [facility] ([facility] o McCord,   MD; Wallace, MD; Wood, MD o [address], [place], [place] [address] o [phone] o Mon-Wed 8:00-6:00, Thur-Fri 8:00-5:00, Sat 9:00-12:00 o Providers come to  see babies at [facility] o Does NOT accept Medicaid o Only accepting siblings of current patients . [facility]  o [address], [place], [place]  [address] o [phone]   Fax - [phone] . [facility] o [address], [place], [place]  [address] o [phone]   Fax - [phone]  [place] ([address]) . [facility] o Cirigliano, DO; Matthews, DO o [address] [place], [place] [address] o [phone] o Mon-Fri 7:00-5:00 o Babies seen by [facility] providers o Does NOT accept Medicaid . [facility] o Briscoe, MD; Howley, PA; Moreira, PA o [address], [place], [place] [address] o [phone] o Mon-Fri 8:00-5:00 o Babies seen by [facility] providers o Accepting Medicaid . [facility] o Boyd, MD; Church, PA; Jones, NP; Osborn, PA o [address], , [place] [address] o [phone] o Mon-Fri 8:00-5:00 o Babies seen by providers at [facility] o Accepting Medicaid  [place] ([address]) . [facility] [place] o Wendling, DO o [address] [place], [place] [address] o [phone] o Mon-Fri 8:00-5:00 o Babies seen by [facility] providers o Does NOT accept Medicaid o Limited availability, please call early in hospitalization to schedule follow-up . [facility] o Calderon, PA; Cummings, MD; Dillard, MD; Martin, PA; Olson, MD; VanDeven, PA o [address], [place], [place] [address] o [phone] o Mon-Fri 8:30-5:00, Sat 9:00-12:00 o Babies seen by providers at [facility] o Accepting Medicaid o Please register online then schedule online or call office o [email or web address] . [facility] ([facility] o Hunter, NP; Kumar, MD; Martin Rogers, PA o [address], [place], [place] [address] o [phone] o Mon-Fri 8:00-5:00 o Babies seen by providers at [facility] o Accepting Medicaid . [facility] ([facility] o [place], MD; Kristi Fleenor, NP; West, MD o [address], [place], [place] [address] o [phone] o Mon-Fri 8:00-5:30, Sat&Sun by appointment (phones open at 8:30) o Babies seen by [facility] providers o Accepting Medicaid o Must be a first-time baby or sibling of current patient . [facility] [place]  o [address], [place], [place]  [address] o [phone]   Fax - [phone]  [place] ([address]) . [facility] o Brown, PA; Cowen, PA; Rice, MD; Helton, PA; Spry, MD o [address] [place], [place] [address] o [phone] o Mon-Thur 8:00-7:00, Fri 8:00-5:00, Sat 8:00-12:00, Sun 9:00-12:00 o Babies seen by [facility] providers o Accepting Medicaid . [facility] [place] o Coe-Goins, MD; Marshall, MD; Pierre-Louis, MD o [address] [place] [address], [place], [place] [address] o [phone] o Mon-Thur 8:00-5:00 o Babies seen by providers at [facility] o Accepting Medicaid . [facility] o Bratton, MD; Coe-Goins, MD; Hayes, MD; Lewis, MD; List, MD; Lott, MD; Marshall, MD; Moran, MD; O'Neal, MD; Pierre-Louis, MD; Pitonzo, MD; Scholer, MD; Spangle, MD o [address] [place], [place]   [address] o [phone] o Mon-Fri 8:00-5:30, Sat (Oct.-Mar.) 9:00-1:00 o Babies seen by providers at [facility] o Accepting Medicaid o Must fill out new patient packet, available online at [email or web address] . [facility] ([facility] o Friddle, NP; Harris, NP; Kelly, NP; Logan, MD;  Melvin, PA; Poth, MD; Ramadoss, MD; Stanton, NP o [address], [place], [place] [address] o [phone] o Mon-Thur 8:00-5:30, Fri 8:00-5:00 o Babies seen by providers at [facility] o Accepting Medicaid  [place] ([address]) . [facility] o Dixon, PA; Richlawn, MD; Pickard, MD; Tapia, PA o [address], [place], [place] [address] o [phone] o Mon-Fri 8:00-5:00 o Babies seen by providers at [facility] o Accepting Medicaid   [place] ([address]) . [facility] [place] o Masneri, DO; Meyers, MD; Nelson, PA o [address], [place], [place] [address] o [phone] o Mon-Fri 8:00-5:00 o Babies seen by providers at [facility] o Does NOT accept Medicaid o Limited appointment availability, please call early in hospitalization  . [facility] [place] o Kunedd, DO; McGowen, MD o [address], [place], [place] [address] o [phone] o Mon-Fri 8:00-5:00 o Babies seen by [facility] providers o Does NOT accept Medicaid . [facility] [place] o Cameron, MD; MacDonald, MD; Michaels, PA; Nayak, MD o [address] [place] [address], [place], [place] [address] o [phone] o Mon-Fri 8:00-5:00 o After hours clinic ([address] [place], [place] [address]) [phone] Mon-Fri 5:00-8:00, Sat 12:00-6:00, Sun 10:00-4:00 o Babies seen by [facility] providers o Accepting Medicaid . [facility] [place] o [address], [place], [place]  [address] o [phone]   Fax - [phone]  [place] ([address]) . [facility] [place] [facility] o Andy, MD o [address], [place], [place] [address] o [phone] o Mon-Fri 8:00-5:00 o Babies seen by [facility] providers o Does NOT accept Medicaid . [facility] [place] ([facility] [place]) o Eksir, MD o [address], [place], [place]  [address] o [phone] o Mon-Thur 8:00-7:00, Fri 8:00-5:00, Sat 8:00-12:00 o Babies seen by providers at [facility] o Accepting Medicaid - but does not have vaccinations in office (must be received elsewhere) o Limited availability, please call early in hospitalization  [place] ([address]) . [facility]  o Charlene Flemming, MD o [address], [place]  [address] o [phone]  Fax [phone]   

## 2020-11-29 NOTE — Anesthesia Procedure Notes (Signed)
Epidural Patient location during procedure: OB Start time: 11/29/2020 8:40 PM End time: 11/29/2020 8:44 PM  Staffing Anesthesiologist: Beryle Lathe, MD Performed: anesthesiologist   Preanesthetic Checklist Completed: patient identified, IV checked, risks and benefits discussed, monitors and equipment checked, pre-op evaluation and timeout performed  Epidural Patient position: sitting Prep: DuraPrep Patient monitoring: continuous pulse ox and blood pressure Approach: midline Location: L3-L4 Injection technique: LOR saline  Needle:  Needle type: Tuohy  Needle gauge: 17 G Needle length: 9 cm Needle insertion depth: 4.5 cm Catheter size: 19 Gauge Catheter at skin depth: 10 cm Test dose: negative and Other (1% lidocaine)  Assessment Events: blood not aspirated  Additional Notes Patient identified. Risks including, but not limited to, bleeding, infection, nerve damage, paralysis, inadequate analgesia, blood pressure changes, nausea, vomiting, allergic reaction, postpartum back pain, itching, and headache were discussed. Patient expressed understanding and wished to proceed. Sterile prep and drape, including hand hygiene, mask, and sterile gloves were used. The patient was positioned and the spine was prepped. The skin was anesthetized with lidocaine. No paraesthesia or other complication noted. The patient did not experience any signs of intravascular injection such as tinnitus or metallic taste in mouth, nor signs of intrathecal spread such as rapid motor block. Please see nursing notes for vital signs. The patient tolerated the procedure well.   Leslye Peer, MDReason for block:procedure for pain

## 2020-11-29 NOTE — Anesthesia Preprocedure Evaluation (Signed)
Anesthesia Evaluation  Patient identified by MRN, date of birth, ID band Patient awake    Reviewed: Allergy & Precautions, NPO status , Patient's Chart, lab work & pertinent test results  History of Anesthesia Complications Negative for: history of anesthetic complications  Airway Mallampati: II   Neck ROM: Full    Dental   Pulmonary Current SmokerPatient did not abstain from smoking.,    Pulmonary exam normal        Cardiovascular negative cardio ROS Normal cardiovascular exam     Neuro/Psych negative neurological ROS  negative psych ROS   GI/Hepatic negative GI ROS, Neg liver ROS,   Endo/Other  negative endocrine ROS  Renal/GU negative Renal ROS     Musculoskeletal negative musculoskeletal ROS (+)   Abdominal   Peds  Hematology negative hematology ROS (+)  Plt 226k    Anesthesia Other Findings Covid test negative 11/23/20, not redone  Reproductive/Obstetrics (+) Pregnancy                             Anesthesia Physical Anesthesia Plan  ASA: II  Anesthesia Plan: Epidural   Post-op Pain Management:    Induction:   PONV Risk Score and Plan: 1 and Treatment may vary due to age or medical condition  Airway Management Planned: Natural Airway  Additional Equipment: None  Intra-op Plan:   Post-operative Plan:   Informed Consent: I have reviewed the patients History and Physical, chart, labs and discussed the procedure including the risks, benefits and alternatives for the proposed anesthesia with the patient or authorized representative who has indicated his/her understanding and acceptance.       Plan Discussed with: Anesthesiologist  Anesthesia Plan Comments: (Labs reviewed. Platelets acceptable, patient not taking any blood thinning medications. Per RN, FHR tracing reported to be stable enough for sitting procedure. Risks and benefits discussed with patient, including  PDPH, backache, epidural hematoma, failed epidural, blood pressure changes, allergic reaction, and nerve injury. Patient expressed understanding and wished to proceed.)        Anesthesia Quick Evaluation

## 2020-11-29 NOTE — Discharge Summary (Signed)
Postpartum Discharge Summary    Patient Name: Sherry Woodward DOB: Sep 19, 1991 MRN: [id number]  Date of admission: 11/29/2020 Delivery date:11/29/2020  Delivering provider: Christin Fudge  Date of discharge: 12/01/2020  Admitting diagnosis: Non-reassuring electronic fetal monitoring tracing [O36.8390] Intrauterine pregnancy: [redacted]w[redacted]d    Secondary diagnosis:  Active Problems:   Supervision of high-risk pregnancy   Limited prenatal care   Fetal size inconsistent with dates   GBS (group B Streptococcus carrier), +RV culture, currently pregnant   Non-reassuring BPP complicating pregnancy, antepartum   IUGR (intrauterine growth restriction) affecting care of mother, third trimester, not applicable or unspecified fetus   Non-reassuring electronic fetal monitoring tracing   IUD (intrauterine device) in place   Vaginal delivery  Additional problems: as noted above   Discharge diagnosis: Term Pregnancy Delivered                                              Post partum procedures:Liletta IUD placed Augmentation: AROM, Pitocin and Cytotec Complications: None  Hospital course: Induction of Labor With Vaginal Delivery   29y.o. yo [id number] admitted to the hospital 11/29/2020 for induction of labor.  Indication for induction: IUGR with non-reactive NST.  Patient had an uncomplicated labor course as follows: Membrane Rupture Time/Date: 8:10 PM ,11/29/2020   Delivery Method:Vaginal, Spontaneous  Episiotomy: None  Lacerations:  None  Details of delivery can be found in separate delivery note.  Patient had a routine postpartum course. Patient is discharged home 12/01/20.  Newborn Data: Birth date:11/29/2020  Birth time:10:15 PM  Gender:Female  Living status:Living  Apgars:8 ,9  Weight:2566 g   Magnesium Sulfate received: No BMZ received: Yes Rhophylac:N/A MMR:N/A T-DaP:declined Flu: declined Transfusion:No  Physical exam  Vitals:   11/30/20 0839 11/30/20  1410 11/30/20 2025 12/01/20 0531  BP: 120/83 99/78 117/69 109/72  Pulse: 62 74 70 66  Resp: _0 Temp: 99.1 F (37.3 C) 98.9 F (37.2 C) 99.4 F (37.4 C) 98.3 F (36.8 C)  TempSrc:  Oral Oral Oral  SpO2:   100% 100%  Weight:      Height:       General: alert, cooperative and no distress Lochia: appropriate Uterine Fundus: firm Incision: N/A DVT Evaluation: No evidence of DVT seen on physical exam. No cords or calf tenderness. No significant calf/ankle edema. Labs: Lab Results  Component Value Date   WBC 6.6 11/29/2020   HGB 12.0 11/29/2020   HCT 38.6 11/29/2020   MCV 88.9 11/29/2020   PLT 226 11/29/2020   No flowsheet data found. Edinburgh Score: Edinburgh Postnatal Depression Scale Screening Tool 11/30/2020  I have been able to laugh and see the funny side of things. 0  I have looked forward with enjoyment to things. 0  I have blamed myself unnecessarily when things went wrong. 0  I have been anxious or worried for no good reason. 0  I have felt scared or panicky for no good reason. 0  Things have been getting on top of me. 0  I have been so unhappy that I have had difficulty sleeping. 0  I have felt sad or miserable. 0  I have been so unhappy that I have been crying. 0  The thought of harming myself has occurred to me. 0  Edinburgh Postnatal Depression Scale Total 0     After visit  meds:  Allergies as of 12/01/2020      Reactions   Aspirin Swelling   Tylenol [acetaminophen] Swelling      Medication List    STOP taking these medications   Comfort Fit Maternity Supp Med Misc   ferrous sulfate 325 (65 FE) MG tablet     TAKE these medications   coconut oil Oil Apply 1 application topically as needed (nipple pain).   ibuprofen 600 MG tablet Commonly known as: ADVIL Take 1 tablet (600 mg total) by mouth every 8 (eight) hours as needed for moderate pain or cramping.   loratadine 10 MG tablet Commonly known as: Claritin Take 1 tablet (10 mg  total) by mouth daily as needed for allergies or itching.   multivitamin-prenatal 27-0.8 MG Tabs tablet Take 1 tablet by mouth daily at 12 noon.   polyethylene glycol 17 g packet Commonly known as: MIRALAX / GLYCOLAX Take 17 g by mouth daily as needed for mild constipation or moderate constipation.       Discharge home in stable condition Infant Feeding: Breast Infant Disposition:home with mother Discharge instruction: per After Visit Summary and Postpartum booklet. Activity: Advance as tolerated. Pelvic rest for 6 weeks.  Diet: routine diet Future Appointments:No future appointments. Follow up Visit:  [place] Follow up in 6 week(s).   Specialty: Obstetrics and Gynecology Contact information: [address], [place] [phone]             Please schedule this patient for a In person postpartum visit in 4 weeks with the following provider: Any provider. Additional Postpartum F/U: IUD string check Low risk pregnancy complicated by: IUGR, late to prenatal care Delivery mode:  Vaginal, Spontaneous  Anticipated Birth Control:  PP IUD placed  , Gildardo Cranker, MD OB Fellow, Faculty Practice 12/01/2020 5:40 AM

## 2020-11-29 NOTE — H&P (Signed)
OBSTETRIC ADMISSION HISTORY AND PHYSICAL  Sherry Woodward is a 29 y.o. female [phone] with IUP at [redacted]w[redacted]d by LMP presenting for IOL-FGR and NRFHT in clinic today. She reports +FMs, No LOF, no VB, no blurry vision, headaches or peripheral edema, and RUQ pain.  She plans on breast and bottle feeding. She request post placental liletta for birth control. She received her prenatal care at [name] (intermittent)-->transfer to [facility]   Dating: By LMP --->  Estimated Date of Delivery: 12/17/20  Sono:    11/23/20@[redacted]w[redacted]d , CWD, normal anatomy, cephalic presentation, 2371g, 6% EFW   Prenatal History/Complications:  FGR (6%ile 2371g on 11/23/20) GBS pos Late/intermittent PNC (moved from [name]) No GTT, normal late a1c  Past Medical History: Past Medical History:  Diagnosis Date   Anemia     Past Surgical History: Past Surgical History:  Procedure Laterality Date   NO PAST SURGERIES      Obstetrical History: OB History    Gravida  7   Para  6   Term  6   Preterm      AB      Living  6     SAB      IAB      Ectopic      Multiple      Live Births  6           Social History Social History   Socioeconomic History   Marital status: Single    Spouse name: Not on file   Number of children: Not on file   Years of education: Not on file   Highest education level: Not on file  Occupational History   Not on file  Tobacco Use   Smoking status: Current Every Day Smoker    Packs/day: 0.25    Types: Cigarettes   Smokeless tobacco: Never Used   Tobacco comment: 6 cigs/day  Vaping Use   Vaping Use: Never used  Substance and Sexual Activity   Alcohol use: Never   Drug use: Never   Sexual activity: Yes  Other Topics Concern   Not on file  Social History Narrative   Not on file   Social Determinants of Health   Financial Resource Strain: Not on file  Food Insecurity: Not on file  Transportation Needs: Not on file  Physical Activity: Not on  file  Stress: Not on file  Social Connections: Not on file    Family History: Family History  Adopted: Yes  Problem Relation Age of Onset   Cancer Father     Allergies: Allergies  Allergen Reactions   Aspirin Swelling   Tylenol [Acetaminophen] Swelling    Medications Prior to Admission  Medication Sig Dispense Refill Last Dose   ferrous sulfate 325 (65 FE) MG tablet Take 1 tablet (325 mg total) by mouth every other day. 60 tablet 5 11/28/2020 at 1100   Prenatal Vit-Fe Fumarate-FA (MULTIVITAMIN-PRENATAL) 27-0.8 MG TABS tablet Take 1 tablet by mouth daily at 12 noon.   11/28/2020 at 1100   Elastic Bandages & Supports (COMFORT FIT MATERNITY SUPP MED) MISC 1 Device by Does not apply route daily. 1 each 0    loratadine (CLARITIN) 10 MG tablet Take 1 tablet (10 mg total) by mouth daily as needed for allergies or itching. 30 tablet 2      Review of Systems   All systems reviewed and negative except as stated in HPI  Blood pressure 121/83, pulse 96, temperature 98.3 F (36.8 C), temperature source Oral, resp. rate  18, height 5\' 11"  (1.803 m), weight 75.3 kg. General appearance: alert, cooperative and no distress Lungs: normal respiratory effort Heart: regular rate and rhythm Abdomen: soft, non-tender; gravid Pelvic: as noted below Extremities: Homans sign is negative, no sign of DVT Presentation: cephalic by BSUS Fetal monitoringBaseline: 150 bpm, Variability: Good {> 6 bpm), Accelerations: Reactive and Decelerations: Absent Uterine activity intermittent Dilation: Closed Effacement (%): Thick Station: -3 Exam by:: Dr. [email or web address]   Prenatal labs: ABO, Rh: --/--/PENDING (12/23 1245) Antibody: PENDING (12/23 1245) Rubella: 2.80 (12/08 1141) RPR: NON REACTIVE (12/17 1747)  HBsAg: Negative (12/08 1141)  HIV: Non Reactive (12/08 1141)  GBS: Positive/-- (12/14 0412)  2 hr Glucola not done, late to care Genetic screening  Not done Anatomy 05[birth year]1993 FGR, otherwise normal,  limited due to GA  Prenatal Transfer Tool  Maternal Diabetes: unknown, late a1c normal, gtt not done, late to care Genetic Screening:  Not done Maternal Ultrasounds/Referrals: IUGR Fetal Ultrasounds or other Referrals:  Referred to Materal Fetal Medicine  Maternal Substance Abuse:  Yes:  Type: Smoker Significant Maternal Medications:  None Significant Maternal Lab Results: Group B Strep positive  Results for orders placed or performed during the hospital encounter of 11/29/20 (from the past 24 hour(s))  Type and screen [facility]   Collection Time: 11/29/20 12:45 PM  Result Value Ref Range   ABO/RH(D) PENDING    Antibody Screen PENDING    Sample Expiration      12/02/2020,2359 Performed at [facility], [address], [place], [place]   CBC   Collection Time: 11/29/20 12:53 PM  Result Value Ref Range   WBC 6.6 4.0 - 10.5 K/uL   RBC 4.34 3.87 - 5.11 MIL/uL   Hemoglobin 12.0 12.0 - 15.0 g/dL   HCT 12/01/20 08.1 - 44.8 %   MCV 88.9 80.0 - 100.0 fL   MCH 27.6 26.0 - 34.0 pg   MCHC 31.1 30.0 - 36.0 g/dL   RDW 18.5 (H) 63.1 - 49.7 %   Platelets 226 150 - 400 K/uL   nRBC 0.0 0.0 - 0.2 %    Patient Active Problem List   Diagnosis Date Noted   Non-reassuring electronic fetal monitoring tracing 11/29/2020   IUGR (intrauterine growth restriction) affecting care of mother, third trimester, not applicable or unspecified fetus 11/24/2020   Non-reassuring BPP complicating pregnancy, antepartum    GBS (group B Streptococcus carrier), +RV culture, currently pregnant 11/23/2020   Supervision of high-risk pregnancy 11/20/2020   Limited prenatal care 11/20/2020   Fetal size inconsistent with dates 11/20/2020    Assessment/Plan:  11/22/2020 Sherry Woodward is a 29 y.o. G7P6006 at [redacted]w[redacted]d here for IOL-FGR (6%ile), NRNST in clinic today.  #IOL: Discussed induction process with patient. Given cervical exam will start with cytotec [redacted]w[redacted]d buccal and place FB when  able. #Pain: PRN, desires epidural #FWB:  cat 1 #ID: GBS pos, PCN ordered #MOF: both #MOC: post placental liletta, ordered. Patient consented, will sign forms. #Circ: declines #Late/limited PNC: Moved from , had 2 visits there and then had a lapse in care. SW consult postpartum. #Tobacco abuse: counseled  [name], MD  11/29/2020, 1:30 PM

## 2020-11-29 NOTE — Progress Notes (Signed)
Pt reports fetal movement with irregular contractions. 

## 2020-11-29 NOTE — Progress Notes (Unsigned)
Received call from NP at [facility]. Pt with nonreactive NST today. Per MFM (Dr. Parke Poisson) pt warrants IOL today given IUGR with EFW 6%. Orders placed for induction. Notified L&D charge nurse and L&D team of patient's arrival soon.  Sheila Oats, MD OB Fellow, Faculty Practice 11/29/2020 10:33 AM

## 2020-11-29 NOTE — Progress Notes (Signed)
Patient Vitals for the past 4 hrs:  BP Temp Temp src Pulse Resp  11/29/20 1930 106/71 97.9 F (36.6 C) Oral 77 17  11/29/20 1820 (!) 108/56 -- -- 85 18   Pain is 3/10.  FHR Cat 1.  Ctx q 2-3 minutes.  Pitocin at 6 mu/min.  Cx 4/80/-2.  AROM w/clear fluid.  Continue present mgt.

## 2020-11-29 NOTE — Progress Notes (Signed)
Subjective:  Sherry Woodward is a 29 y.o. [phone] at [redacted]w[redacted]d being seen today for ongoing prenatal care. Began care in this office on December 8.  She is currently monitored for the following issues for this high-risk pregnancy and has Supervision of high-risk pregnancy; Limited prenatal care; Fetal size inconsistent with dates; GBS (group B Streptococcus carrier), +RV culture, currently pregnant; H/O fetal biophysical profile; Non-reassuring BPP complicating pregnancy, antepartum; [redacted] weeks gestation of pregnancy; and IUGR (intrauterine growth restriction) affecting care of mother, third trimester, not applicable or unspecified fetus on their problem list.  Patient reports occasional contractions.  Contractions: Irregular. Vag. Bleeding: None.  Movement: Present. Denies leaking of fluid.   The following portions of the patient's history were reviewed and updated as appropriate: allergies, current medications, past family history, past medical history, past social history, past surgical history and problem list. Problem list updated.  Objective:   Vitals:   11/29/20 0851  BP: 121/68  Pulse: 87  Weight: 168 lb (76.2 kg)    Fetal Status: Fetal Heart Rate (bpm): 145                                                                                                                                                                               145 Fundal Height: 30 cm Movement: Present     General:  Alert, oriented and cooperative. Patient is in no acute distress.  Skin: Skin is warm and dry. No rash noted.   Cardiovascular: Normal heart rate noted  Respiratory: Normal respiratory effort, no problems with respiration noted  Abdomen: Soft, gravid, appropriate for gestational age. Pain/Pressure: Present     Pelvic:  Cervical exam deferred        Extremities: Normal range of motion.  Edema: None  Mental Status: Normal mood and affect. Normal behavior. Normal judgment and thought content.    Urinalysis:      Assessment and Plan:  Pregnancy: G7P6006 at [redacted]w[redacted]d  1. Supervision of high risk pregnancy in third trimester Irregular contractions Drink at least 8 8-oz glasses of water every day. Watch for labor and go to the hospital when contractions are 5 minutes apart. Select Pediatrician - list given Declined glucola testing - not fasting today.  Declines for this pregnancy - has never had diabetes in a pregnancy before.  Has not had glucola testing in prenatal care she received in [name] - care through 20 weeks.  HGB A1C was normal 2 weeks ago.  2. IUGR (intrauterine growth restriction) affecting care of mother, third trimester, not applicable or unspecified fetus Reports her other babies were small - 5-6 pounds at birth Will schedule weekly ASAP - Not scheduled and last done on  Dec 18.  [place] on 11-24-20 recommended delivery but client sent home with improved BPP but was not scheduled yet for further antenatal testing.  Had betamethasone given in the hospital in case client's dates were not accurate.  She given 12-17-20 as the date given to her in prenatal care in [name].  Spoke with Dr. Parke Poisson from MFM.  He checked with Dr. Mayford Knife in L&D.  Plan of care is for client to go to L&D today for induction.   Client had NST in the office today while those phone calls were being made.  Had only one 10x10 accel in 20 minutes.  Nonreactive NST.  Had Baseline of 140 with moderate variability, irregular contractions and no decelerations.  Advised to go to the hospital for admission.  Client is crying and upset.  Talked to her extensively about this plan to induce her as her baby is in the 6th percentile and has a nonressuring NST today.  She kept asking why her baby is small and was it because of her smoking.  Advised that one particular reason cannot be fully identified, but that the important info is that she is now full term and could have her baby at [redacted]w[redacted]d and this is recommended by MFM and the  doctor in L&D today.  She agrees to go to the hospital.   - [place] MFM FETAL BPP WO NON STRESS; Future - [place] MFM UA CORD DOPPLER; Future  Term labor symptoms and general obstetric precautions including but not limited to vaginal bleeding, contractions, leaking of fluid and fetal movement were reviewed in detail with the patient. Please refer to After Visit Summary for other counseling recommendations.  No follow-ups on file.  Will need to come for postpartum exam.  Nolene Bernheim, RN, MSN, NP-BC Nurse Practitioner, [facility] for [employer], [facility] 11/29/2020 11:01 AM

## 2020-11-29 NOTE — Procedures (Signed)
  Post-Placental IUD Insertion Procedure Note  Patient identified, informed consent signed prior to delivery, signed copy in chart, time out was performed.    Vaginal, labial and perineal areas thoroughly inspected for lacerations. 0 degree laceration identified - Liletta  - - IUD inserted with inserter per manufacturer's instructions.    Strings trimmed to the level of the introitus. Patient tolerated procedure well.  Patient given post procedure instructions and IUD care card with expiration date.  Patient is asked to keep IUD strings tucked in her vagina until her postpartum follow up visit in 4-6 weeks. Patient advised to abstain from sexual intercourse and pulling on strings before her follow-up visit. Patient verbalized an understanding of the plan of care and agrees.   

## 2020-11-29 NOTE — Progress Notes (Signed)
Labor Progress Note Sherry Woodward is a 29 y.o. [phone] at [redacted]w[redacted]d presented for IOL-IUGR (6%ile) and NRNST in clinic today. S: Doing well without complaints.  O:  BP 121/83   Pulse 96   Temp 98.3 F (36.8 C) (Oral)   Resp 18   Ht 5\' 11"  (1.803 m)   Wt 75.3 kg   BMI 23.17 kg/m  EFM: baseline 150bpm/mod variability/+accels/no decels Toco: q1-5 min  CVE: Dilation: 3 Effacement (%): 50 Cervical Position: Posterior Station: -1 Exam by:: [email or web address] MD   A&P: 29 y.o. 38 [redacted]w[redacted]d presented for IOL-IUGR (6%ile) and NRNST in clinic today. #IOL: s/p cytotec x1. Good cervical change since last exam. Will start pitocin 2x2 at this time and AROM when able. #Pain: PRN, desires epidural #FWB: cat 1 #GBS positive, adequate prophylaxis #Late/limited PNC: Moved from [redacted]w[redacted]d, had 2 visits there and then had a lapse in care. SW consult postpartum. #Tobacco abuse: counseled  [name], MD 6:15 PM

## 2020-11-30 ENCOUNTER — Encounter (HOSPITAL_COMMUNITY): Payer: Self-pay | Admitting: Obstetrics & Gynecology

## 2020-11-30 DIAGNOSIS — Z8759 Personal history of other complications of pregnancy, childbirth and the puerperium: Secondary | ICD-10-CM

## 2020-11-30 DIAGNOSIS — Z975 Presence of (intrauterine) contraceptive device: Secondary | ICD-10-CM

## 2020-11-30 LAB — RPR: RPR Ser Ql: NONREACTIVE

## 2020-11-30 LAB — RAPID URINE DRUG SCREEN, HOSP PERFORMED
Amphetamines: NOT DETECTED
Barbiturates: NOT DETECTED
Benzodiazepines: NOT DETECTED
Cocaine: NOT DETECTED
Opiates: NOT DETECTED
Tetrahydrocannabinol: NOT DETECTED

## 2020-11-30 MED ORDER — COCONUT OIL OIL: 1.0000 "application " | TOPICAL_OIL | Status: DC | PRN

## 2020-11-30 MED ORDER — MEDROXYPROGESTERONE ACETATE 150 MG/ML IM SUSP: 150.0000 mg | INTRAMUSCULAR | Status: DC | PRN

## 2020-11-30 MED ORDER — SIMETHICONE 80 MG PO CHEW: 80.0000 mg | CHEWABLE_TABLET | ORAL | Status: DC | PRN

## 2020-11-30 MED ORDER — DIBUCAINE (PERIANAL) 1 % EX OINT: 1.0000 "application " | TOPICAL_OINTMENT | CUTANEOUS | Status: DC | PRN

## 2020-11-30 MED ORDER — WITCH HAZEL-GLYCERIN EX PADS: 1.0000 "application " | MEDICATED_PAD | CUTANEOUS | Status: DC | PRN

## 2020-11-30 MED ORDER — PRENATAL MULTIVITAMIN CH
1.0000 | ORAL_TABLET | Freq: Every day | ORAL | Status: DC
  Administered 2020-11-30 – 2020-12-01 (×2): 1 via ORAL
  Filled 2020-11-30 (×2): qty 1

## 2020-11-30 MED ORDER — BISACODYL 10 MG RE SUPP: 10.0000 mg | Freq: Every day | RECTAL | Status: DC | PRN

## 2020-11-30 MED ORDER — DIPHENHYDRAMINE HCL 25 MG PO CAPS
25.0000 mg | ORAL_CAPSULE | Freq: Four times a day (QID) | ORAL | Status: DC | PRN
  Administered 2020-11-30: 01:00:00 25 mg via ORAL
  Filled 2020-11-30: qty 1

## 2020-11-30 MED ORDER — ONDANSETRON HCL 4 MG/2ML IJ SOLN: 4.0000 mg | INTRAMUSCULAR | Status: DC | PRN

## 2020-11-30 MED ORDER — DOCUSATE SODIUM 100 MG PO CAPS
100.0000 mg | ORAL_CAPSULE | Freq: Two times a day (BID) | ORAL | Status: DC
  Administered 2020-11-30 – 2020-12-01 (×3): 100 mg via ORAL
  Filled 2020-11-30 (×4): qty 1

## 2020-11-30 MED ORDER — IBUPROFEN 600 MG PO TABS
600.0000 mg | ORAL_TABLET | Freq: Four times a day (QID) | ORAL | Status: DC
  Administered 2020-11-30 – 2020-12-01 (×6): 600 mg via ORAL
  Filled 2020-11-30 (×7): qty 1

## 2020-11-30 MED ORDER — MEASLES, MUMPS & RUBELLA VAC IJ SOLR: 0.5000 mL | Freq: Once | INTRAMUSCULAR | Status: DC

## 2020-11-30 MED ORDER — METHYLERGONOVINE MALEATE 0.2 MG/ML IJ SOLN: 0.2000 mg | INTRAMUSCULAR | Status: DC | PRN

## 2020-11-30 MED ORDER — ONDANSETRON HCL 4 MG PO TABS: 4.0000 mg | ORAL_TABLET | ORAL | Status: DC | PRN

## 2020-11-30 MED ORDER — FERROUS SULFATE 325 (65 FE) MG PO TABS
325.0000 mg | ORAL_TABLET | ORAL | Status: DC
  Administered 2020-11-30: 12:00:00 325 mg via ORAL
  Filled 2020-11-30: qty 1

## 2020-11-30 MED ORDER — BENZOCAINE-MENTHOL 20-0.5 % EX AERO: 1.0000 "application " | INHALATION_SPRAY | CUTANEOUS | Status: DC | PRN

## 2020-11-30 MED ORDER — FLEET ENEMA 7-19 GM/118ML RE ENEM: 1.0000 | ENEMA | Freq: Every day | RECTAL | Status: DC | PRN

## 2020-11-30 MED ORDER — METHYLERGONOVINE MALEATE 0.2 MG PO TABS: 0.2000 mg | ORAL_TABLET | ORAL | Status: DC | PRN

## 2020-11-30 MED ORDER — TETANUS-DIPHTH-ACELL PERTUSSIS 5-2.5-18.5 LF-MCG/0.5 IM SUSY: 0.5000 mL | PREFILLED_SYRINGE | Freq: Once | INTRAMUSCULAR | Status: DC

## 2020-11-30 NOTE — Progress Notes (Signed)
CSW re-consulted and ask to check in with MOB due to MOB reporting that other children are living in [name] at this time. CSW agreeable and spoke with MOB at bedside.   CSW congratulated MOB on the birth of infant. CSW advised MOB of CSW's role and the reason for CSW coming to speak with her. MOB expressed that she is super happy since she has given birth. MOB expressed that she has 6 other children all who live in [name]. CSW inquired from [facility] on why children didn't come to [facility] with her. MOB expressed that she wanted to come to [place] to be with her sister and that her sister "had been telling me how great it is here". MOB expressed that she arrived in [place] in August 2021. MOB expressed that she willingly left her children in the care of her sister Maryclare Bean [phone] while she came to get settled and established in [place] with per MOB "I plan to go get then around May 2022". MOB expressed that she still has custody of her children and assured CSW that children were not placed by CPS with sister but at MOB's desire. CSW understanding of this as MOB reported no CPS involvement to CSW while speaking to her. CSW followed up with sister listed and was advised that yes she has MOB's other children and that they are safe with her.    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 MOB advised CSW that she has the support from her other sister Alyson Reedy. MOB expressed that has all needed items to care for infant with plans for infant to sleep in basinet once arrived home. MOB reported that FOB will not be involved with infant at this time. CSW understanding.  CSW inquired from [facility] on her mental health hx in which MOB expressed that she has none. MOB reported that she has no feelings of SI, HI nor is she involved in DV.   CSW took time to provide MOB with PPD and SIDS education. MOB was given PPD Checklist in order to keep track of feelings as they may relate  to PPD. MOB thanked CSW and expressed no other needs.    Claude Manges , MSW, LCSW [facility] [phone]

## 2020-11-30 NOTE — Anesthesia Postprocedure Evaluation (Signed)
Anesthesia Post Note  Patient: Sherry Woodward  Procedure(s) Performed: AN AD HOC LABOR EPIDURAL     Patient location during evaluation: Mother Baby Anesthesia Type: Epidural Level of consciousness: awake Pain management: satisfactory to patient Vital Signs Assessment: post-procedure vital signs reviewed and stable Respiratory status: spontaneous breathing Cardiovascular status: stable Anesthetic complications: no   No complications documented.  Last Vitals:  Vitals:   11/30/20 0500 11/30/20 0839  BP: 116/76 120/83  Pulse: 68 62  Resp: 18 17  Temp: 36.7 C 37.3 C  SpO2: 100%     Last Pain:  Vitals:   11/30/20 0839  TempSrc:   PainSc: 0-No pain   Pain Goal:                   [name]

## 2020-11-30 NOTE — Progress Notes (Signed)
Patient ID: Sherry Woodward, female   DOB: 08/22/1991, 29 y.o.   MRN: [id number] POSTPARTUM PROGRESS NOTE  Post Partum Day 1  Subjective:  Sherry Woodward is a 29 y.o. [phone] s/p SVD at [redacted]w[redacted]d.  No acute events overnight.  Pt denies problems with ambulating, voiding or po intake.  She denies nausea or vomiting.  Pain is well controlled.  She has had flatus. She has not had bowel movement.  Lochia Moderate.   Objective: Blood pressure 120/83, pulse 62, temperature 99.1 F (37.3 C), resp. rate 17, height 5\' 11"  (1.803 m), weight 75.3 kg, SpO2 100 %, unknown if currently breastfeeding.  Physical Exam:  General: alert, cooperative and no distress Chest: no respiratory distress Heart:regular rate, distal pulses intact Abdomen: soft, nontender,  Uterine Fundus: firm, appropriately tender DVT Evaluation: No calf swelling or tenderness Extremities: No edema Skin: warm, dry;  Recent Labs    11/29/20 1253  HGB 12.0  HCT 38.6    Assessment/Plan: 12/01/20 Sherry Woodward is a 29 y.o. [phone] s/p SVD at [redacted]w[redacted]d   PPD#1 - Doing well Contraception: pp [redacted]w[redacted]d IUD Feeding: bottle Dispo: Plan for discharge tomorrow.   LOS: 1 day   [place], CNM, CNM 11/30/2020, 12:03 PM

## 2020-11-30 NOTE — Discharge Instructions (Signed)
IUD PLACEMENT POST-PROCEDURE INSTRUCTIONS  1. You may take Ibuprofen, Aleve or Tylenol for pain if needed.  Cramping should resolve within in 24 hours.  .  You need to call if you have a fever (more than 100.4), any moderate to severe pelvic pain, fever, or foul smelling vaginal discharge.  Irregular bleeding is common the first several months after having an IUD placed. You do not need to call for this reason unless you are concerned.  . Shower or bathe as normal   You should have a follow-up appointment in 4-6 weeks for a re-check to make sure you are not having any problems. Your strings can also be trimmed at this visit, as they will be longer than necessary.    Postpartum Care After Vaginal Delivery This sheet gives you information about how to care for yourself from the time you deliver your baby to up to 6-12 weeks after delivery (postpartum period). Your health care provider may also give you more specific instructions. If you have problems or questions, contact your health care provider. Follow these instructions at home: Vaginal bleeding  It is normal to have vaginal bleeding (lochia) after delivery. Wear a sanitary pad for vaginal bleeding and discharge. ? During the first week after delivery, the amount and appearance of lochia is often similar to a menstrual period. ? Over the next few weeks, it will gradually decrease to a dry, yellow-brown discharge. ? For most women, lochia stops completely by 4-6 weeks after delivery. Vaginal bleeding can vary from woman to woman.  Change your sanitary pads frequently. Watch for any changes in your flow, such as: ? A sudden increase in volume. ? A change in color. ? Large blood clots.  If you pass a blood clot from your vagina, save it and call your health care provider to discuss. Do not flush blood clots down the toilet before talking with your health care provider.  Do not use tampons or douches until your health care provider says  this is safe.  If you are not breastfeeding, your period should return 6-8 weeks after delivery. If you are feeding your child breast milk only (exclusive breastfeeding), your period may not return until you stop breastfeeding. Perineal care  Keep the area between the vagina and the anus (perineum) clean and dry as told by your health care provider. Use medicated pads and pain-relieving sprays and creams as directed.  If you had a cut in the perineum (episiotomy) or a tear in the vagina, check the area for signs of infection until you are healed. Check for: ? More redness, swelling, or pain. ? Fluid or blood coming from the cut or tear. ? Warmth. ? Pus or a bad smell.  You may be given a squirt bottle to use instead of wiping to clean the perineum area after you go to the bathroom. As you start healing, you may use the squirt bottle before wiping yourself. Make sure to wipe gently.  To relieve pain caused by an episiotomy, a tear in the vagina, or swollen veins in the anus (hemorrhoids), try taking a warm sitz bath 2-3 times a day. A sitz bath is a warm water bath that is taken while you are sitting down. The water should only come up to your hips and should cover your buttocks. Breast care  Within the first few days after delivery, your breasts may feel heavy, full, and uncomfortable (breast engorgement). Milk may also leak from your breasts. Your health care provider can  suggest ways to help relieve the discomfort. Breast engorgement should go away within a few days.  If you are breastfeeding: ? Wear a bra that supports your breasts and fits you well. ? Keep your nipples clean and dry. Apply creams and ointments as told by your health care provider. ? You may need to use breast pads to absorb milk that leaks from your breasts. ? You may have uterine contractions every time you breastfeed for up to several weeks after delivery. Uterine contractions help your uterus return to its normal  size. ? If you have any problems with breastfeeding, work with your health care provider or Advertising copywriter.  If you are not breastfeeding: ? Avoid touching your breasts a lot. Doing this can make your breasts produce more milk. ? Wear a good-fitting bra and use cold packs to help with swelling. ? Do not squeeze out (express) milk. This causes you to make more milk. Intimacy and sexuality  Ask your health care provider when you can engage in sexual activity. This may depend on: ? Your risk of infection. ? How fast you are healing. ? Your comfort and desire to engage in sexual activity.  You are able to get pregnant after delivery, even if you have not had your period. If desired, talk with your health care provider about methods of birth control (contraception). Medicines  Take over-the-counter and prescription medicines only as told by your health care provider.  If you were prescribed an antibiotic medicine, take it as told by your health care provider. Do not stop taking the antibiotic even if you start to feel better. Activity  Gradually return to your normal activities as told by your health care provider. Ask your health care provider what activities are safe for you.  Rest as much as possible. Try to rest or take a nap while your baby is sleeping. Eating and drinking   Drink enough fluid to keep your urine pale yellow.  Eat high-fiber foods every day. These may help prevent or relieve constipation. High-fiber foods include: ? Whole grain cereals and breads. ? Brown rice. ? Beans. ? Fresh fruits and vegetables.  Do not try to lose weight quickly by cutting back on calories.  Take your prenatal vitamins until your postpartum checkup or until your health care provider tells you it is okay to stop. Lifestyle  Do not use any products that contain nicotine or tobacco, such as cigarettes and e-cigarettes. If you need help quitting, ask your health care provider.  Do  not drink alcohol, especially if you are breastfeeding. General instructions  Keep all follow-up visits for you and your baby as told by your health care provider. Most women visit their health care provider for a postpartum checkup within the first 3-6 weeks after delivery. Contact a health care provider if:  You feel unable to cope with the changes that your child brings to your life, and these feelings do not go away.  You feel unusually sad or worried.  Your breasts become red, painful, or hard.  You have a fever.  You have trouble holding urine or keeping urine from leaking.  You have little or no interest in activities you used to enjoy.  You have not breastfed at all and you have not had a menstrual period for 12 weeks after delivery.  You have stopped breastfeeding and you have not had a menstrual period for 12 weeks after you stopped breastfeeding.  You have questions about caring for yourself or  your baby.  You pass a blood clot from your vagina. Get help right away if:  You have chest pain.  You have difficulty breathing.  You have sudden, severe leg pain.  You have severe pain or cramping in your lower abdomen.  You bleed from your vagina so much that you fill more than one sanitary pad in one hour. Bleeding should not be heavier than your heaviest period.  You develop a severe headache.  You faint.  You have blurred vision or spots in your vision.  You have bad-smelling vaginal discharge.  You have thoughts about hurting yourself or your baby. If you ever feel like you may hurt yourself or others, or have thoughts about taking your own life, get help right away. You can go to the nearest emergency department or call:  Your local emergency services (911 in the [place]  A suicide crisis helpline, such as the National Suicide Prevention Lifeline at 520-790-1493. This is open 24 hours a day. Summary  The period of time right after you deliver your  newborn up to 6-12 weeks after delivery is called the postpartum period.  Gradually return to your normal activities as told by your health care provider.  Keep all follow-up visits for you and your baby as told by your health care provider. This information is not intended to replace advice given to you by your health care provider. Make sure you discuss any questions you have with your health care provider. Document Revised: 11/27/2017 Document Reviewed: 09/07/2017 Elsevier Patient Education  2020 [employer].
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
## 2020-12-01 MED ORDER — COCONUT OIL OIL: 1.0000 "application " | TOPICAL_OIL | 0 refills | Status: AC | PRN

## 2020-12-01 MED ORDER — POLYETHYLENE GLYCOL 3350 17 G PO PACK
17.0000 g | PACK | Freq: Every day | ORAL | Status: DC
  Administered 2020-12-01: 10:00:00 17 g via ORAL
  Filled 2020-12-01: qty 1

## 2020-12-01 MED ORDER — IBUPROFEN 600 MG PO TABS: 600.0000 mg | ORAL_TABLET | Freq: Three times a day (TID) | ORAL | 0 refills | Status: DC | PRN

## 2020-12-01 MED ORDER — POLYETHYLENE GLYCOL 3350 17 G PO PACK: 17.0000 g | PACK | Freq: Every day | ORAL | 0 refills | Status: AC | PRN

## 2020-12-01 NOTE — Progress Notes (Signed)
AVS printed and discharged instructions given to patient. Patient instructed to call for follow-up appointment and to pick up prescriptions. All questions answered and pt verbalized understanding.  °

## 2020-12-01 NOTE — Progress Notes (Signed)
Per CSW, Sherry Woodward, MOB reported other children are with aunt Sherry Woodward in [name]. Sherry Woodward confirmed children are safely with her.  Sherry Woodward 04/14/2008 Sherry Woodward 05/18/2010 Sherry Woodward 02/14/2011 Sherry Woodward 4/11/ Sherry Woodward 12/23/2012 Sherry Woodward 03/24/15   D. Dortha Kern, MSW, LCSW Clinical Social Worker [phone]

## 2020-12-12 ENCOUNTER — Ambulatory Visit: Payer: Medicaid Other

## 2020-12-25 ENCOUNTER — Other Ambulatory Visit: Payer: Self-pay | Admitting: Nurse Practitioner

## 2020-12-25 DIAGNOSIS — O365930 Maternal care for other known or suspected poor fetal growth, third trimester, not applicable or unspecified: Secondary | ICD-10-CM

## 2020-12-27 ENCOUNTER — Ambulatory Visit (INDEPENDENT_AMBULATORY_CARE_PROVIDER_SITE_OTHER): Payer: Medicaid Other

## 2020-12-27 ENCOUNTER — Other Ambulatory Visit: Payer: Self-pay

## 2020-12-27 NOTE — Patient Instructions (Signed)

## 2020-12-27 NOTE — Progress Notes (Signed)
Post Partum Visit Note  Sherry Woodward is a 30 y.o. [phone] female who presents for a postpartum visit. She is 4 weeks postpartum following a normal spontaneous vaginal delivery.  I have fully reviewed the prenatal and intrapartum course. The delivery was at 37.3 gestational weeks.  Anesthesia: epidural. Postpartum course has been unremarkable. Baby is doing well. Baby is feeding by both breast and bottle - [employer] Start. Bleeding staining only. Bowel function is normal. Bladder function is normal. Patient is not sexually active. Contraception method is IUD. Postpartum depression screening: negative=0.  Patient reports she is doing well.  She denies feelings of depression.  She states she does not receive support from anyone with infant care.  Patient with IUD in place and declines string check today.   The pregnancy intention screening data noted above was reviewed. Potential methods of contraception were discussed. The patient elected to proceed with IUD or IUS.    Edinburgh Postnatal Depression Scale - 12/27/20 1024      Edinburgh Postnatal Depression Scale:  In the Past 7 Days   I have been able to laugh and see the funny side of things. 0    I have looked forward with enjoyment to things. 0    I have blamed myself unnecessarily when things went wrong. 0    I have been anxious or worried for no good reason. 0    I have felt scared or panicky for no good reason. 0    Things have been getting on top of me. 0    I have been so unhappy that I have had difficulty sleeping. 0    I have felt sad or miserable. 0    I have been so unhappy that I have been crying. 0    The thought of harming myself has occurred to me. 0    Edinburgh Postnatal Depression Scale Total 0            The following portions of the patient's history were reviewed and updated as appropriate: allergies, current medications, past family history, past medical history, past social history, past surgical history  and problem list.  Review of Systems Pertinent items noted in HPI and remainder of comprehensive ROS otherwise negative.    Objective:  BP 127/78   Pulse 70   Wt 157 lb (71.2 kg)   Breastfeeding Yes   BMI 21.90 kg/m    General:   alert, cooperative and no distress Bright Mood and Positive Affect   Breasts:  Not Examined  Lungs: clear to auscultation bilaterally  Heart:  regular rate and rhythm  Abdomen: normal findings: bowel sounds normal and soft, non-tender   Vulva:  not evaluated  Vagina: not evaluated  Cervix:  Not Evaluated  Corpus: not examined  Adnexa:  not evaluated  Rectal Exam: Not performed.        Assessment:   4 weeks postpartum exam.  Normal Involution PPIUD Declines Vaginal Exam Today Pap Smear Needed Smoker   Plan:  -Informed that it is okay to return to work and normal activities after today. -Patient requests and given RTW note for after Feb 4th. -Discussed need for pap smear and IUD string check. -Patient declines.  Agreeable to appt in a few months for assessment.  -Given information for smoking cessation.   Essential components of care per ACOG recommendations:  1.  Mood and well being: Patient with negative depression screening today. Reviewed local resources for support.  - Patient does  use tobacco. She expresses desire for quitting. - hx of drug use? No    2. Infant care and feeding:  -Patient currently breastmilk feeding? Yes  Reviewed importance of draining breast regularly to support lactation. -Social determinants of health (SDOH) reviewed in EPIC. No concerns  3. Sexuality, contraception and birth spacing - Patient does not know want a pregnancy in the next year.  Desired family size is unsure of how many children she wants.  - Reviewed forms of contraception in tiered fashion. Patient desired IUD today.   - Discussed birth spacing of 18 months  4. Sleep and fatigue -Encouraged family/partner/community support of 4 hrs of  uninterrupted sleep to help with mood and fatigue  5. Physical Recovery  - Discussed patients delivery and complications.  Patient reports delivery "went fast!" - Patient had a no degree laceration, perineal healing reviewed. Patient expressed understanding - Patient has urinary incontinence? No - Patient is safe to resume physical and sexual activity  6.  Health Maintenance - Last pap smear date is unsure and none was noted on file. No Mammogram  7. No Chronic Disease - PCP follow up   Cherre Robins, MSN, CNM [facility] [employer], [facility]

## 2022-06-30 ENCOUNTER — Encounter (HOSPITAL_COMMUNITY): Payer: Self-pay

## 2022-06-30 ENCOUNTER — Emergency Department (HOSPITAL_COMMUNITY)
Admission: EM | Admit: 2022-06-30 | Discharge: 2022-07-01 | Disposition: A | Discharge status: Home / Self Care | Payer: Medicaid Other | Attending: Emergency Medicine | Admitting: Emergency Medicine

## 2022-06-30 ENCOUNTER — Other Ambulatory Visit: Payer: Self-pay

## 2022-06-30 DIAGNOSIS — Y92410 Unspecified street and highway as the place of occurrence of the external cause: Secondary | ICD-10-CM | POA: Diagnosis not present

## 2022-06-30 DIAGNOSIS — S63502A Unspecified sprain of left wrist, initial encounter: Secondary | ICD-10-CM | POA: Diagnosis not present

## 2022-06-30 DIAGNOSIS — S6992XA Unspecified injury of left wrist, hand and finger(s), initial encounter: Secondary | ICD-10-CM | POA: Diagnosis present
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
## 2022-06-30 NOTE — ED Notes (Signed)
Pt ambulatory in lobby.  

## 2022-06-30 NOTE — ED Notes (Signed)
Pt walking to cafeteria.  

## 2022-07-01 ENCOUNTER — Emergency Department (HOSPITAL_COMMUNITY): Payer: Medicaid Other

## 2022-07-01 MED ORDER — METHOCARBAMOL 500 MG PO TABS: 500.0000 mg | ORAL_TABLET | Freq: Three times a day (TID) | ORAL | 0 refills | Status: AC | PRN
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
## 2022-07-01 NOTE — Progress Notes (Signed)
Orthopedic Tech Progress Note Patient Details:  Sherry Woodward 11-01-1991 [id number]  Ortho Devices Type of Ortho Device: Velcro wrist splint Ortho Device/Splint Location: LUE Ortho Device/Splint Interventions: Ordered, Application, Adjustment   Post Interventions Patient Tolerated: Well Instructions Provided: Care of device  Donald Pore 07/01/2022, 8:42 AM

## 2022-07-01 NOTE — Discharge Instructions (Signed)
Follow-up with primary care doctor if symptoms continue.

## 2022-11-13 IMAGING — US US MFM OB DETAIL+14 WK
2 series · 12 of 28 positions shown · non-contrast
Comparison: none

[Series 1: us mfm ob detail+14 wk · 86 acquisitions, 11 frames shown (1 of 2)]
[im 4/86]
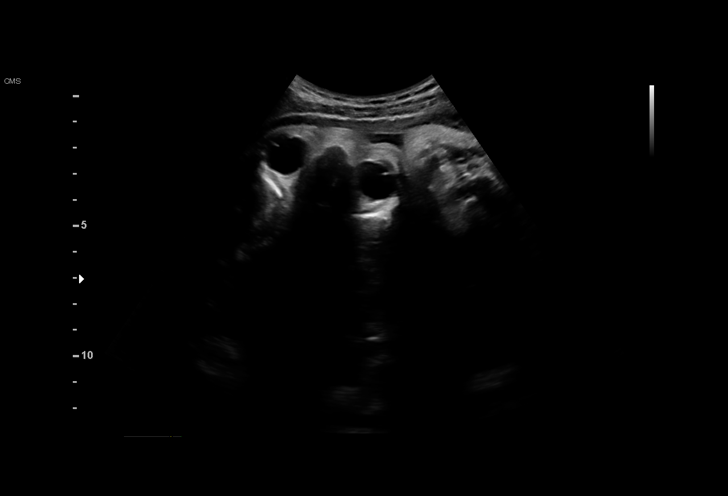
[im 11/86]
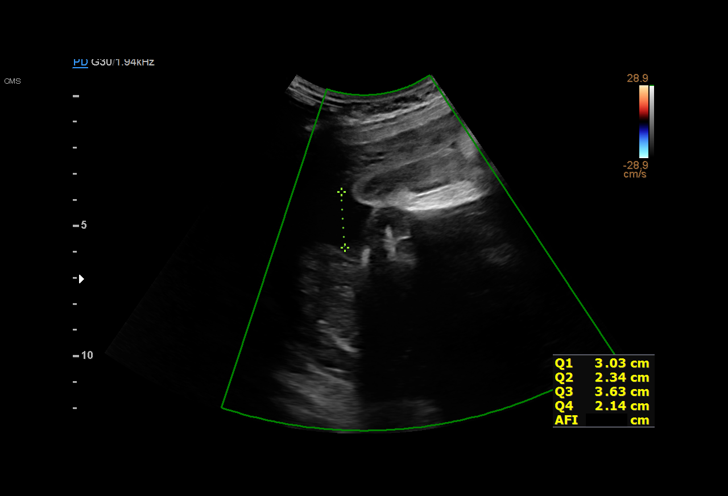
[im 18/86]
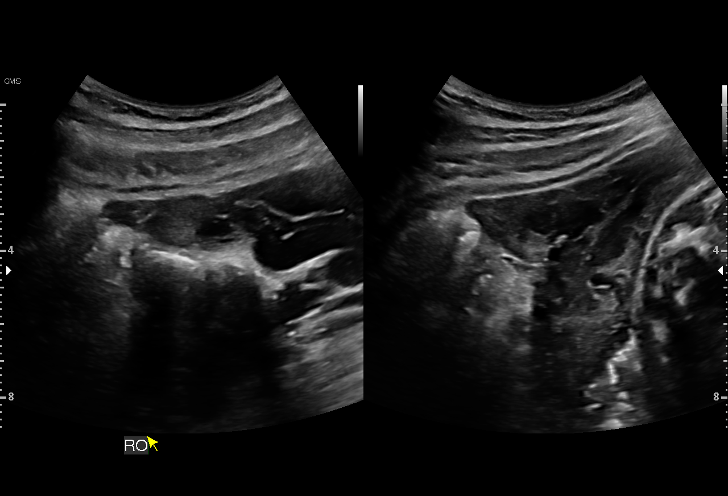
[im 29/86]
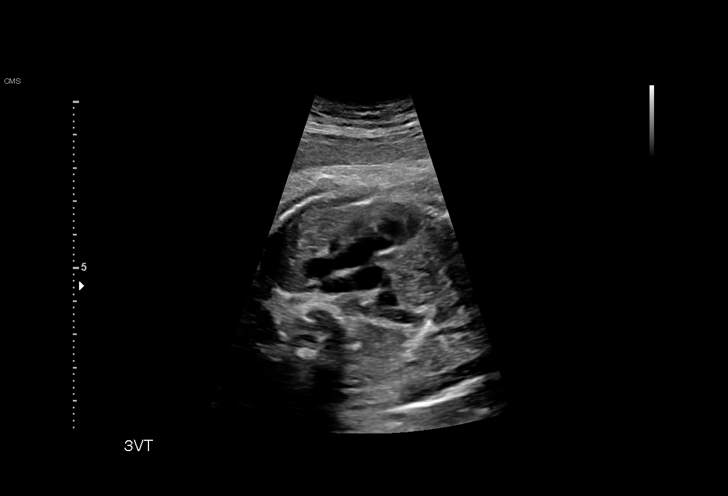
[im 36/86]
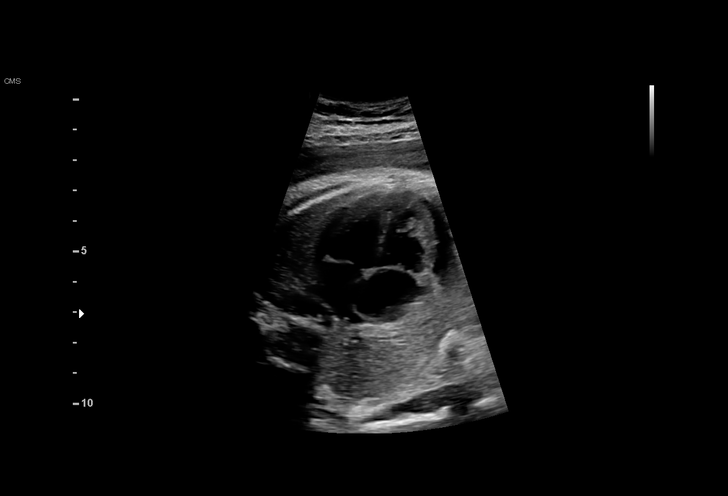
[im 43/86]
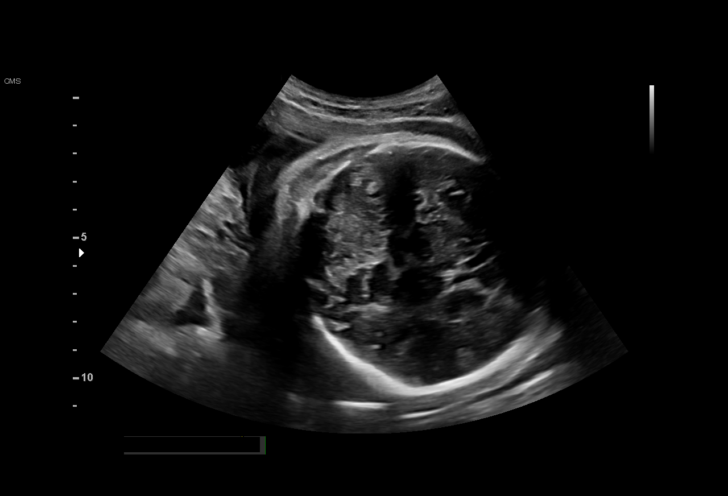
[im 54/86]
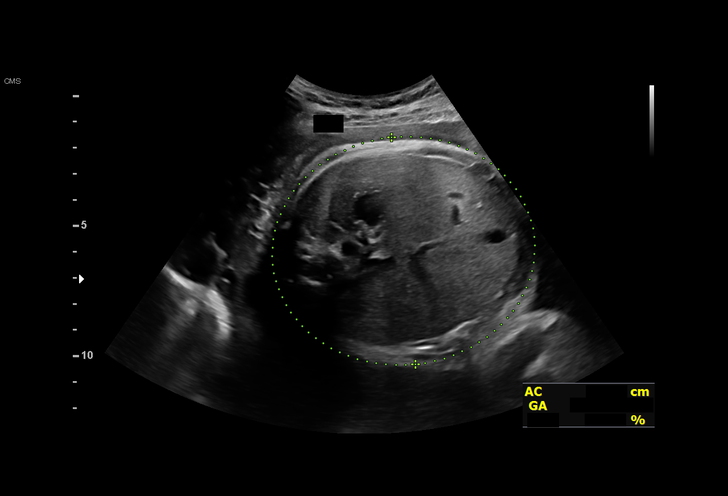
[im 61/86]
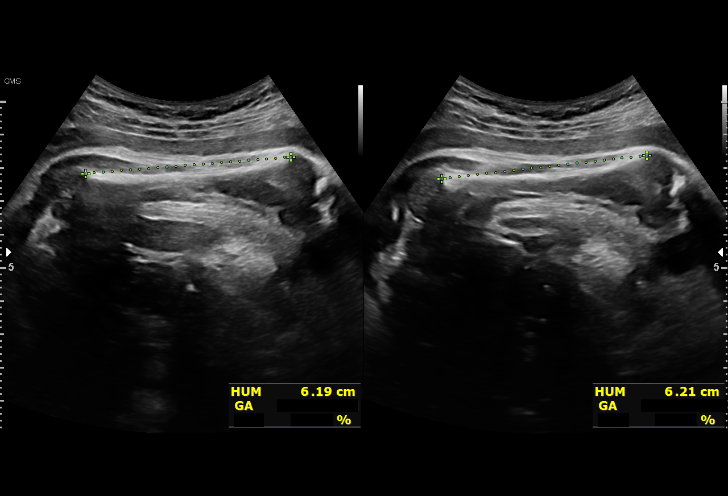
[im 68/86]
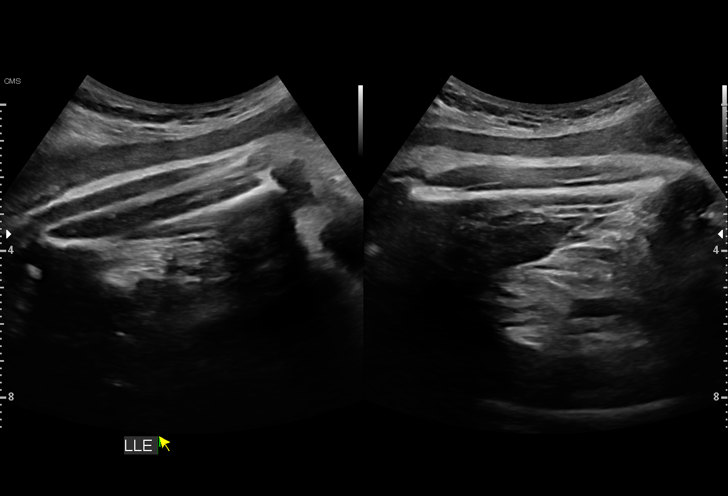
[im 78/86]
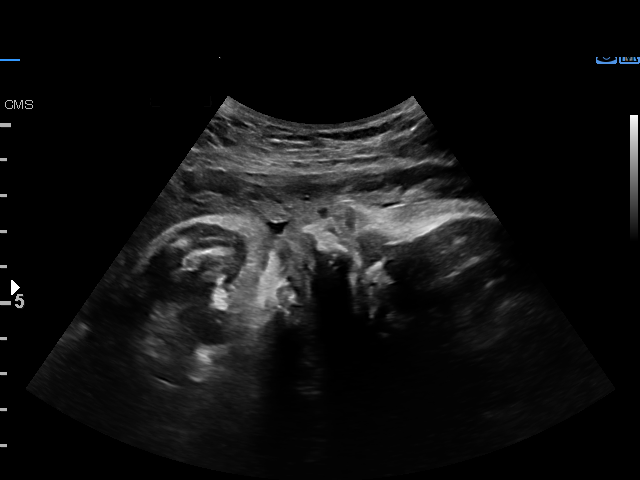
[im 86/86]
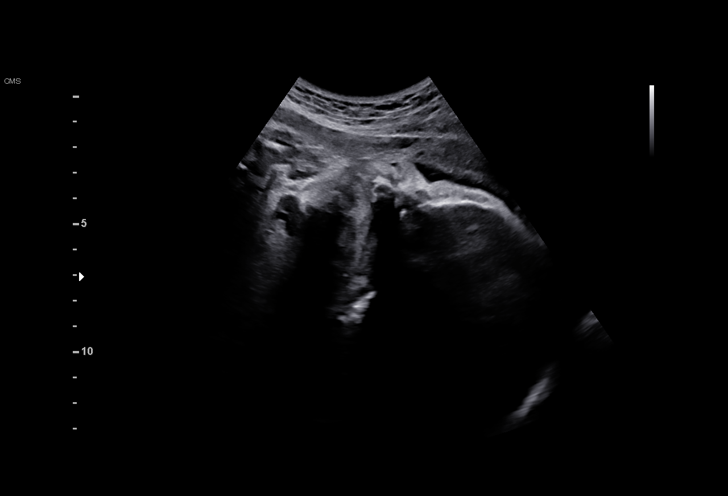

[Series 3: us mfm ob detail+14 wk · 1 of 11 slices shown (2 of 2)]
[im 6/11]
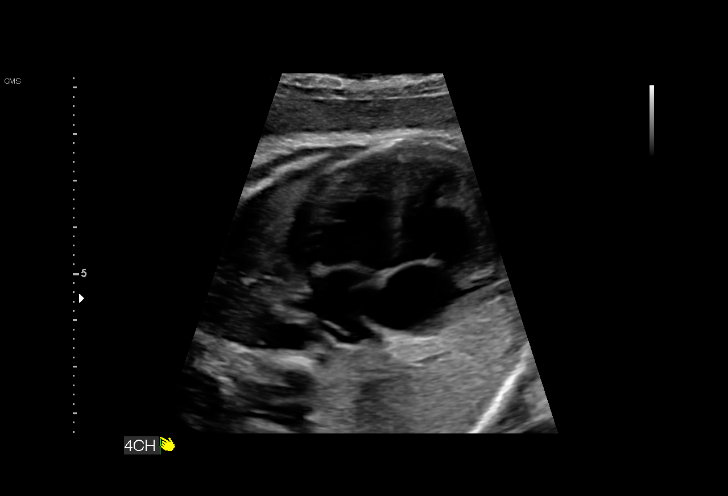

[12 of 28 positions shown; findings below may reference images not displayed]

Indications

 36 weeks gestation of pregnancy
 Antenatal screening for malformations
 Uterine size-date discrepancy, third trimester
 (S<D)
 Insufficient Prenatal Care
 Encounter for uncertain dates
 Grand multiparity, antepartum
Fetal Evaluation

 Num Of Fetuses:         1
 Fetal Heart Rate(bpm):  133
 Cardiac Activity:       Observed
 Presentation:           Cephalic
 Placenta:               Posterior
 P. Cord Insertion:      Not well visualized

 Amniotic Fluid
 AFI FV:      Within normal limits

 AFI Sum(cm)     %Tile       Largest Pocket(cm)
 11.             30
 RUQ(cm)       RLQ(cm)       LUQ(cm)        LLQ(cm)
 3
Biophysical Evaluation

 Amniotic F.V:   Within normal limits       F. Tone:        Not Observed
 F. Movement:    Not Observed               Score:          [DATE]
 F. Breathing:   Observed
Biometry

 BPD:      84.1  mm     G. Age:  33w 6d        4.1  %    CI:        74.49   %    70 - 86
                                                         FL/HC:      22.4   %    20.8 -
 HC:      309.3  mm     G. Age:  34w 4d        1.5  %    HC/AC:      1.05        0.92 -
 AC:      294.6  mm     G. Age:  33w 3d        1.9  %    FL/BPD:     82.3   %    71 - 87
 FL:       69.2  mm     G. Age:  35w 4d         21  %    FL/AC:      23.5   %    20 - 24
 HUM:      61.7  mm     G. Age:  35w 5d         51  %

 LV:        4.2  mm
 Est. FW:    7109  gm      5 lb 4 oz      6  %
OB History

 Gravidity:    7         Term:   6        Prem:   0        SAB:   0
 TOP:          0       Ectopic:  0        Living: 6
Gestational Age

 LMP:           36w 4d        Date:  03/12/20                 EDD:   12/17/20
 U/S Today:     34w 3d                                        EDD:   01/01/21
 Best:          36w 4d     Det. By:  LMP  (03/12/20)          EDD:   12/17/20
Anatomy

 Cranium:               Appears normal         LVOT:                   Appears normal
 Cavum:                 Appears normal         Ductal Arch:            Not well visualized
 Ventricles:            Appears normal         Diaphragm:              Appears normal
 Choroid Plexus:        Appears normal         Stomach:                Appears normal, left
                                                                       sided
 Cerebellum:            Not well visualized    Abdomen:                Appears normal
 Posterior Fossa:       Not well visualized    Abdominal Wall:         Not well visualized
 Nuchal Fold:           Not applicable (>20    Cord Vessels:           Appears normal (3
                        wks GA)                                        vessel cord)
 Face:                  Appears normal         Kidneys:                Appear normal
                        (orbits and profile)
 Lips:                  Appears normal         Bladder:                Appears normal
 Palate:                Not well visualized    Spine:                  Not well visualized
 Thoracic:              Appears normal         Upper Extremities:      Visualized
 Heart:                 Pericardial            Lower Extremities:      Visualized
                        effusion
 RVOT:                  Appears normal

 Other:  Nasal bone visualized. Technically difficult due to advanced
         gestational age.
Doppler - Fetal Vessels

 Umbilical Artery
  S/D     %tile      RI    %tile                             ADFV    RDFV
  2.49       58     0.6       65                                No      No
Cervix Uterus Adnexa

 Cervix
 Not visualized (advanced GA >79wks)

 Uterus
 No abnormality visualized.

 Right Ovary
 Within normal limits.

 Left Ovary
 Within normal limits.

 Cul De Sac
 No free fluid seen.

 Adnexa
 No abnormality visualized.
Impression

 Late prenatal care.  Patient relocated from [place] to
 [HOSPITAL].  She had inadequate prenatal care and has not
 had ultrasound for dating.  Limited ultrasound study was
 performed last month for complaints of abdominal pain.
 Patient has not had screening for fetal aneuploidies or for
 gestational diabetes.  Patient reports she is very sure of her
 LMP date.
 Obstetric history significant for 6 previous term deliveries.

 On today's ultrasound, fetal biometry is consistent with 34
 weeks and 3 days.  Fetal biometry lags gestational age
 (established by LMP date) by 2 weeks.  The estimated fetal
 weight is at the 6 percentile.  Amniotic fluid is normal.  No
 obvious fetal structural defects are seen but anatomical
 survey is limited because of advanced gestational age.
 Pericardial effusion (measuring 46 mm) is seen.  No obvious
 structural heart defect is seen.
  Fetal movements and tone did not meet the criteria of
 BPP.Umbilical artery Doppler showed normal forward
 diastolic flow.  BPP [DATE].

 We have not amended her EDD.  As per [facility]
 recommendations, fetal biometry should be more than 3
 weeks in the third trimester to change EDD.

 I explained the finding of fetal growth restriction and abnormal
 biophysical profile score.  I explained pericardial effusion and
 also reassured her that there are no structural heart defects.
 I have recommended that the patient go to the [facility] for
 prolonged monitoring and repeat BPP in 4 to 6 hours.

 We arranged for transport to take the patient to the [facility].  I
 discussed with [facility] team
Recommendations

 -NST monitoring for about 2 hours.
 -If patient cannot stay for BPP (and NST reactive), she can
 go home and return for BPP either today evening or tomorrow.
 -If patient is undelivered, we recommend weekly BPP and
 Doppler studies.
 -Provided antenatal testing is reassuring and because of her
 uncertain dates, delivery should not be before 39 weeks
 gestation.
                      Koraniteka, [place]

## 2022-11-14 IMAGING — US US MFM FETAL BPP W/O NON-STRESS
1 series · 15 of 28 positions shown · non-contrast
Comparison: none

[Series 1: us mfm fetal bpp w/o non-stress · 30 acquisitions, 15 frames shown]
[im 1/30]
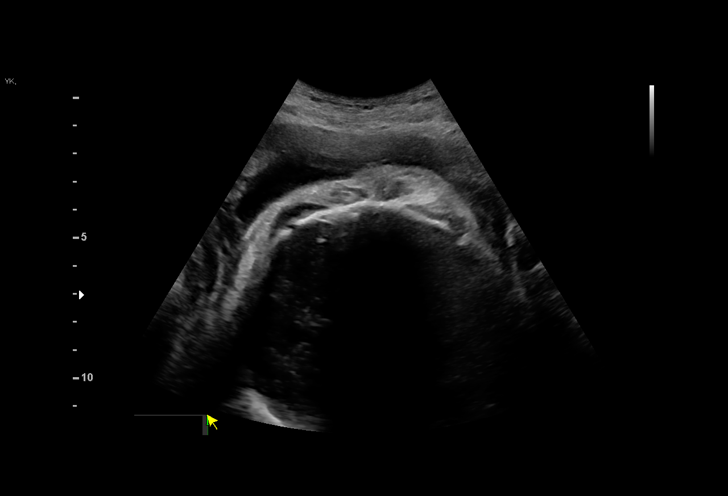
[im 3/30]
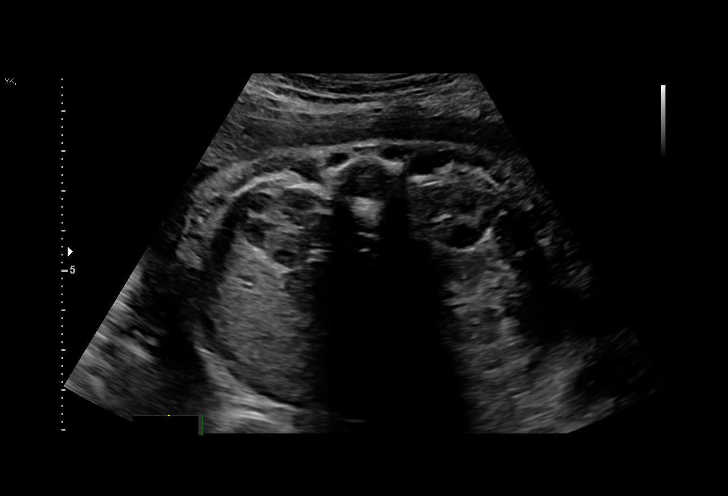
[im 5/30]
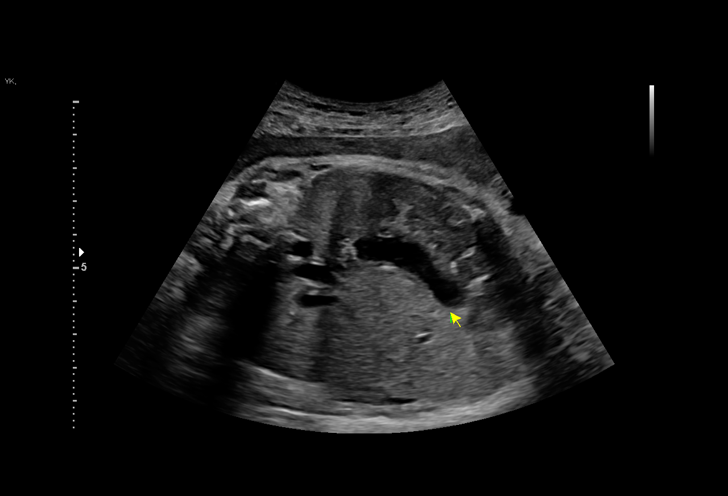
[im 7/30]
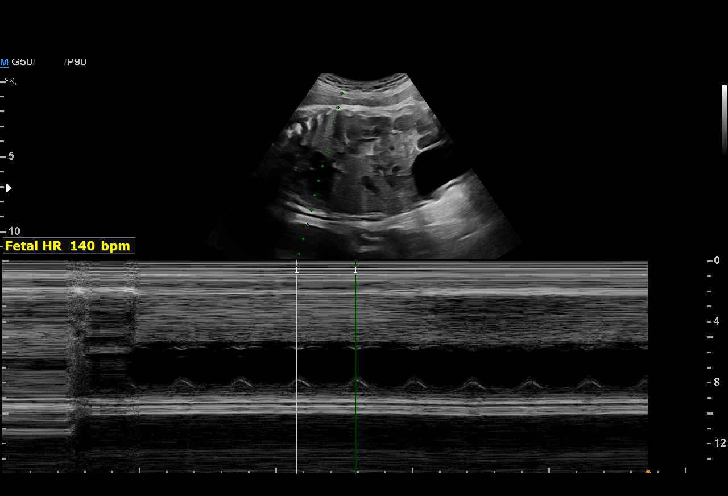
[im 9/30]
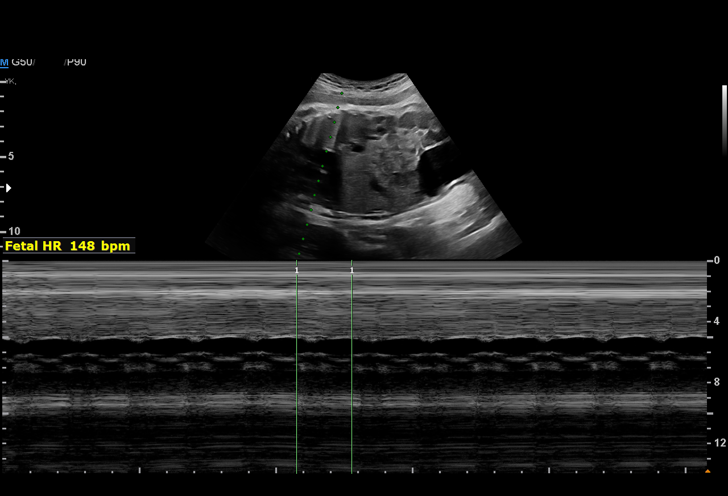
[im 11/30]
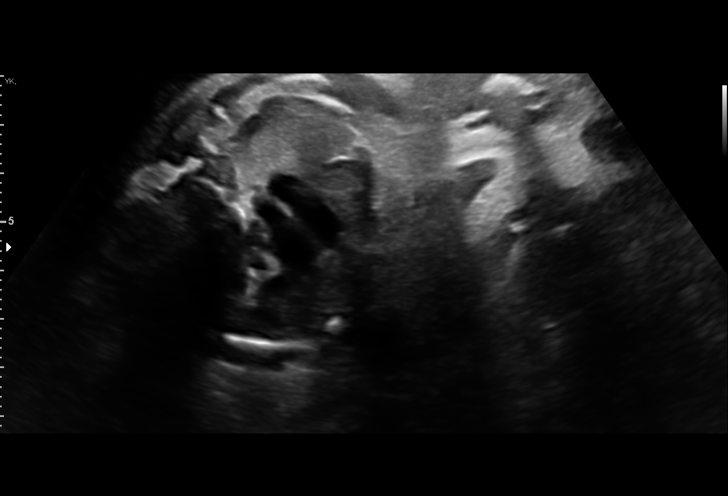
[im 13/30]
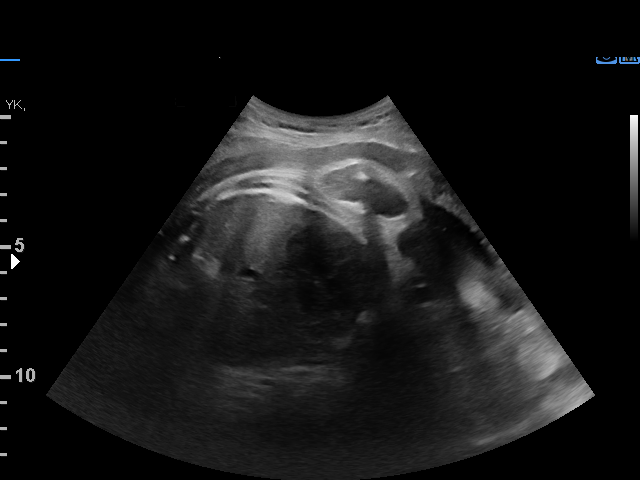
[im 16/30]
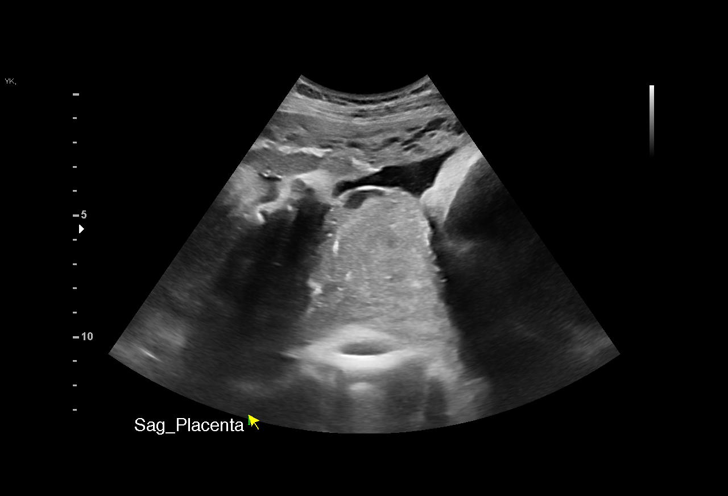
[im 17/30]
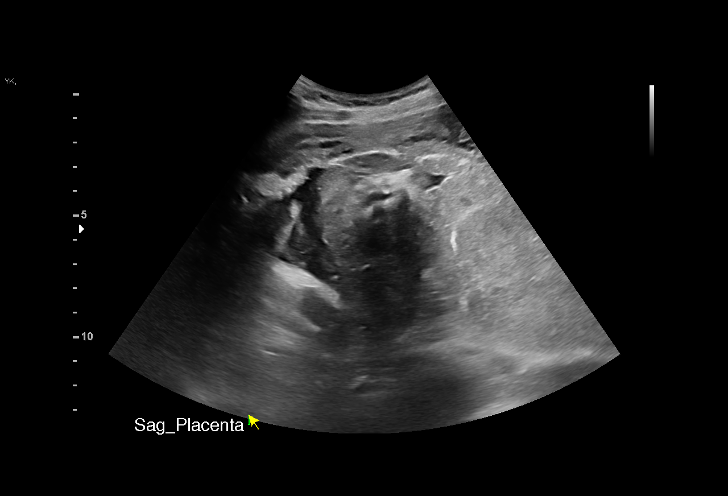
[im 19/30]
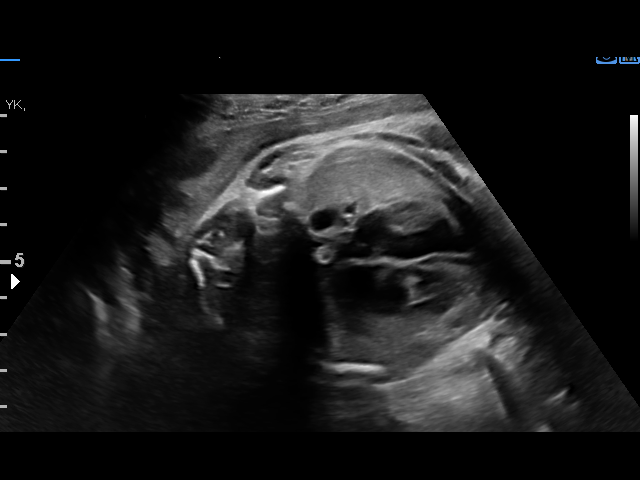
[im 21/30]
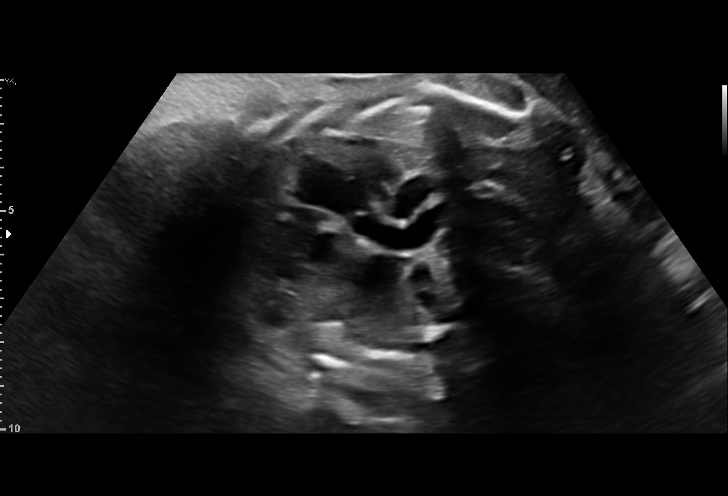
[im 23/30]
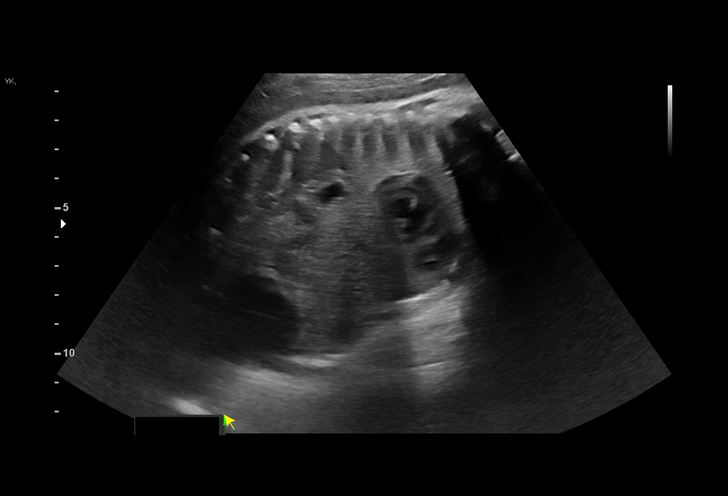
[im 25/30]
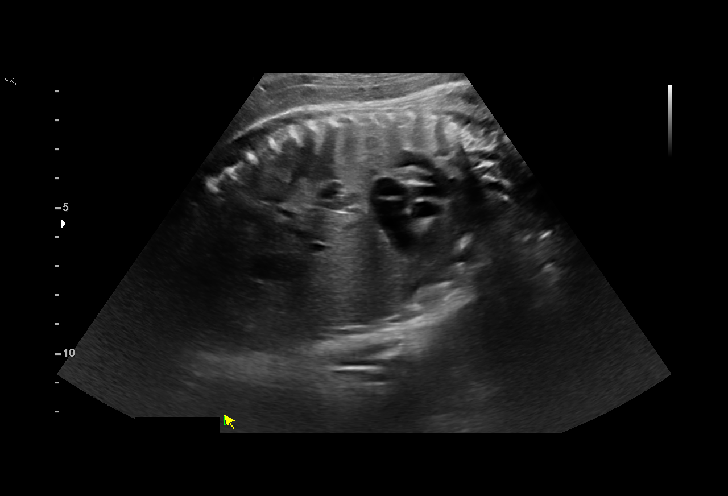
[im 27/30]
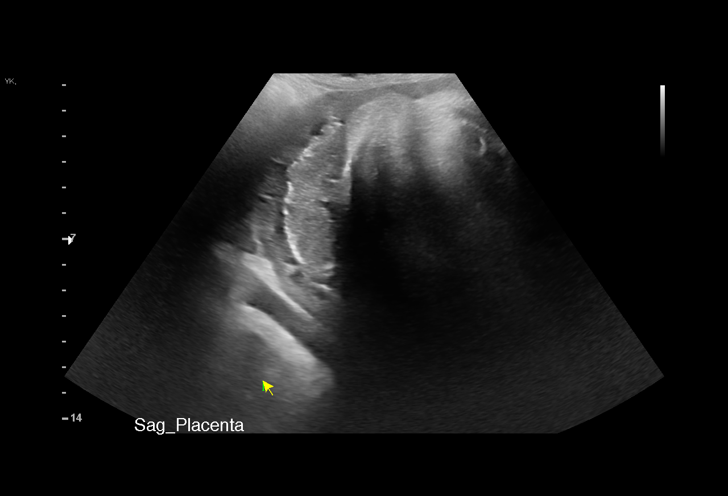
[im 30/30]
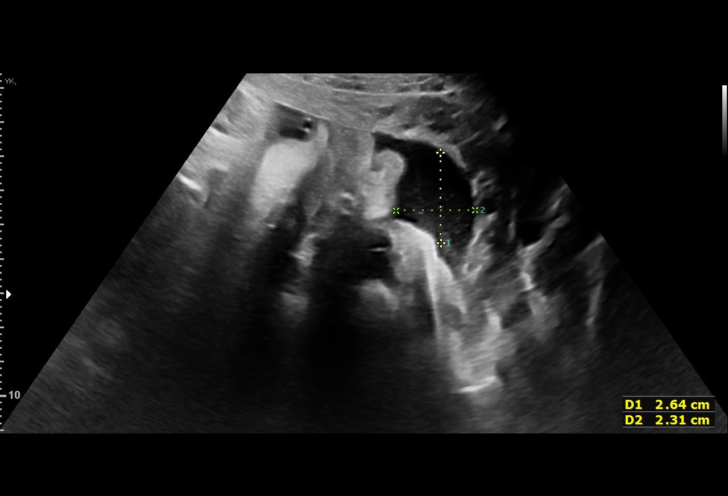

[15 of 28 positions shown; findings below may reference images not displayed]

[REDACTED]
                   [id number]

Indications

 Uterine size-date discrepancy, third trimester
 (S<D)
 Antenatal screening for malformations
 Insufficient Prenatal Care
 Encounter for uncertain dates
 Grand multiparity, antepartum
 36 weeks gestation of pregnancy
Fetal Evaluation

 Num Of Fetuses:          1
 Fetal Heart Rate(bpm):   140
 Cardiac Activity:        Observed
 Presentation:            Cephalic
 Placenta:                Posterior

 Amniotic Fluid
 AFI FV:      Subjectively low-normal

 AFI Sum(cm)     %Tile       Largest Pocket(cm)
 8.2             10

 RUQ(cm)       RLQ(cm)       LUQ(cm)        LLQ(cm)
 2.8           1.2           2
Biophysical Evaluation

 Amniotic F.V:   Pocket => 2 cm             F. Tone:         Observed
 F. Movement:    Observed                   Score:           [DATE]
 F. Breathing:   Not Observed
OB History

 Gravidity:    7         Term:   6        Prem:   0        SAB:   0
 TOP:          0       Ectopic:  0        Living: 6
Gestational Age

 LMP:           36w 5d        Date:  03/12/20                 EDD:   12/17/20
 Best:          36w 5d     Det. By:  LMP  (03/12/20)          EDD:   12/17/20
Anatomy

 RVOT:                  Appears normal         Kidneys:                Appear normal
 Diaphragm:             Appears normal         Bladder:                Appears normal
 Stomach:               Appears normal, left
                        sided
Comments

 This patient has been hospitalized due to an IUGR fetus with
 a biophysical profile that was [DATE] followed by another
 biophysical profile that was [DATE] yesterday.  She has
 received a complete course of antenatal corticosteroids.
 A biophysical profile performed today was [DATE].  She
 received a -2 for fetal breathing movements that did not meet
 criteria.  She had a reactive nonstress test today making her
 overall biophysical profile score [DATE].
 Due to fetal growth restriction, delivery should be considered
 at around 37 weeks.

## 2023-07-31 ENCOUNTER — Other Ambulatory Visit: Payer: Self-pay

## 2023-07-31 ENCOUNTER — Encounter (HOSPITAL_COMMUNITY): Payer: Self-pay

## 2023-07-31 ENCOUNTER — Emergency Department (HOSPITAL_COMMUNITY)
Admission: EM | Admit: 2023-07-31 | Discharge: 2023-07-31 | Disposition: A | Discharge status: Home / Self Care | Payer: Medicaid Other | Attending: Emergency Medicine | Admitting: Emergency Medicine

## 2023-07-31 DIAGNOSIS — J029 Acute pharyngitis, unspecified: Secondary | ICD-10-CM | POA: Diagnosis present

## 2023-07-31 DIAGNOSIS — F17200 Nicotine dependence, unspecified, uncomplicated: Secondary | ICD-10-CM | POA: Insufficient documentation

## 2023-07-31 DIAGNOSIS — Z1152 Encounter for screening for COVID-19: Secondary | ICD-10-CM | POA: Diagnosis not present

## 2023-07-31 DIAGNOSIS — J020 Streptococcal pharyngitis: Secondary | ICD-10-CM

## 2023-07-31 DIAGNOSIS — L509 Urticaria, unspecified: Secondary | ICD-10-CM | POA: Diagnosis not present

## 2023-07-31 LAB — BASIC METABOLIC PANEL
Anion gap: 8 (ref 5–15)
BUN: 9 mg/dL (ref 6–20)
CO2: 26 mmol/L (ref 22–32)
Calcium: 8.2 mg/dL — ABNORMAL LOW (ref 8.9–10.3)
Chloride: 101 mmol/L (ref 98–111)
Creatinine, Ser: 0.84 mg/dL (ref 0.44–1.00)
GFR, Estimated: >60 mL/min (ref >60)
Glucose, Bld: 106 mg/dL — ABNORMAL HIGH (ref 70–99)
Potassium: 3.2 mmol/L — ABNORMAL LOW (ref 3.5–5.1)
Sodium: 135 mmol/L (ref 135–145)

## 2023-07-31 LAB — CBC
HCT: 38.1 % (ref 36.0–46.0)
Hemoglobin: 12.7 g/dL (ref 12.0–15.0)
MCH: 29.7 pg (ref 26.0–34.0)
MCHC: 33.3 g/dL (ref 30.0–36.0)
MCV: 89.2 fL (ref 80.0–100.0)
Platelets: 185 10*3/uL (ref 150–400)
RBC: 4.27 MIL/uL (ref 3.87–5.11)
RDW: 13.3 % (ref 11.5–15.5)
WBC: 6.2 10*3/uL (ref 4.0–10.5)
nRBC: 0 % (ref 0.0–0.2)

## 2023-07-31 LAB — RESP PANEL BY RT-PCR (RSV, FLU A&B, COVID)  RVPGX2
Influenza A by PCR: NEGATIVE
Influenza B by PCR: NEGATIVE
Resp Syncytial Virus by PCR: NEGATIVE
SARS Coronavirus 2 by RT PCR: NEGATIVE

## 2023-07-31 LAB — GROUP A STREP BY PCR: Group A Strep by PCR: DETECTED — AB

## 2023-07-31 LAB — HCG, QUANTITATIVE, PREGNANCY: hCG, Beta Chain, Quant, S: <1 m[IU]/mL (ref <5)

## 2023-07-31 MED ORDER — AMOXICILLIN 500 MG PO CAPS: 500.0000 mg | ORAL_CAPSULE | Freq: Two times a day (BID) | ORAL | 0 refills | Status: AC

## 2023-07-31 MED ORDER — PREDNISONE 20 MG PO TABS
60.0000 mg | ORAL_TABLET | Freq: Once | ORAL | Status: AC
  Administered 2023-07-31: 60 mg via ORAL
  Filled 2023-07-31: qty 3

## 2023-07-31 MED ORDER — AMOXICILLIN 500 MG PO CAPS
500.0000 mg | ORAL_CAPSULE | Freq: Once | ORAL | Status: AC
  Administered 2023-07-31: 500 mg via ORAL
  Filled 2023-07-31: qty 1

## 2023-07-31 MED ORDER — IBUPROFEN 800 MG PO TABS
800.0000 mg | ORAL_TABLET | Freq: Once | ORAL | Status: AC
  Administered 2023-07-31: 800 mg via ORAL
  Filled 2023-07-31: qty 1

## 2023-07-31 MED ORDER — IBUPROFEN 800 MG PO TABS: 800.0000 mg | ORAL_TABLET | Freq: Three times a day (TID) | ORAL | 0 refills | Status: AC

## 2023-07-31 NOTE — ED Provider Notes (Addendum)
[place] EMERGENCY DEPARTMENT AT [facility] Provider Note   CSN: [id number] Arrival date & time: 07/31/23  2952     History  No chief complaint on file.   Sherry Woodward is a 32 y.o. female who presents to the emergency department complaining of allergic reaction with hives. Been going on about 2 days. Hx of hives of unknown origin, fairly consistently. Has been taking benadryl twice daily with minimal relief, most recently taken with EMS during transport. Started with a sore throat and tonsillar swelling, thought she had strep. Did not take anything for it but was doing salt water gargles. Hives to the legs and buttocks. No SOB or wheezing. No nausea or vomiting. No hx of anaphylactic reactions.   HPI     Home Medications Prior to Admission medications   Medication Sig Start Date End Date Taking? Authorizing Provider  amoxicillin (AMOXIL) 500 MG capsule Take 1 capsule (500 mg total) by mouth 2 (two) times daily for 10 days. 07/31/23 08/10/23 Yes Randon Somera T, PA-C  ibuprofen (ADVIL) 800 MG tablet Take 1 tablet (800 mg total) by mouth 3 (three) times daily. 07/31/23  Yes Genecis Veley T, PA-C  coconut oil OIL Apply 1 application topically as needed (nipple pain). 12/01/20   Sheila Oats, MD  diphenhydrAMINE (BENADRYL) 25 MG tablet Take 25 mg by mouth daily as needed for allergies.    [provider]  docusate sodium (COLACE) 100 MG capsule Take 100 mg by mouth daily as needed for mild constipation.    [provider]  loratadine (CLARITIN) 10 MG tablet Take 1 tablet (10 mg total) by mouth daily as needed for allergies or itching. 11/26/20 11/26/21  Levie Heritage, DO  methocarbamol (ROBAXIN) 500 MG tablet Take 1 tablet (500 mg total) by mouth every 8 (eight) hours as needed for muscle spasms. 07/01/22   Benjiman Core, MD  polyethylene glycol (MIRALAX / GLYCOLAX) 17 g packet Take 17 g by mouth daily as needed for mild constipation or  moderate constipation. 12/01/20   Sheila Oats, MD  Prenatal Vit-Fe Fumarate-FA (MULTIVITAMIN-PRENATAL) 27-0.8 MG TABS tablet Take 1 tablet by mouth daily at 12 noon.    [provider]      Allergies    Aspirin and Tylenol [acetaminophen]    Review of Systems   Review of Systems  HENT:  Positive for sore throat and trouble swallowing.   Skin:  Positive for rash.  All other systems reviewed and are negative.   Physical Exam Updated Vital Signs BP 120/75   Pulse 81   Temp 100 F (37.8 C) (Oral)   Resp 12   Ht 5\' 11"  (1.803 m)   Wt 62.6 kg   SpO2 100%   BMI 19.25 kg/m  Physical Exam Vitals and nursing note reviewed.  Constitutional:      Appearance: Normal appearance.  HENT:     Head: Normocephalic and atraumatic.     Mouth/Throat:     Lips: Pink.     Mouth: Mucous membranes are moist. No angioedema.     Pharynx: Oropharynx is clear. Uvula midline.     Tonsils: No tonsillar exudate or tonsillar abscesses. 2+ on the right. 2+ on the left.  Eyes:     Conjunctiva/sclera: Conjunctivae normal.  Cardiovascular:     Rate and Rhythm: Normal rate and regular rhythm.  Pulmonary:     Effort: Pulmonary effort is normal. No respiratory distress.     Breath sounds: Normal breath  sounds.  Abdominal:     General: There is no distension.     Palpations: Abdomen is soft.     Tenderness: There is no abdominal tenderness.  Skin:    General: Skin is warm and dry.     Comments: Urticarial rash noted to the bilateral legs  Neurological:     General: No focal deficit present.     Mental Status: She is alert.     ED Results / Procedures / Treatments   Labs (all labs ordered are listed, but only abnormal results are displayed) Labs Reviewed  GROUP A STREP BY PCR - Abnormal; Notable for the following components:      Result Value   Group A Strep by PCR DETECTED (*)    All other components within normal limits  BASIC METABOLIC PANEL - Abnormal; Notable for the following  components:   Potassium 3.2 (*)    Glucose, Bld 106 (*)    Calcium 8.2 (*)    All other components within normal limits  RESP PANEL BY RT-PCR (RSV, FLU A&B, COVID)  RVPGX2  CBC  HCG, QUANTITATIVE, PREGNANCY    EKG None  Radiology No results found.  Procedures Procedures    Medications Ordered in ED Medications  amoxicillin (AMOXIL) capsule 500 mg (has no administration in time range)  predniSONE (DELTASONE) tablet 60 mg (60 mg Oral Given 07/31/23 0846)  ibuprofen (ADVIL) tablet 800 mg (800 mg Oral Given 07/31/23 0846)    ED Course/ Medical Decision Making/ A&P                                 Medical Decision Making Amount and/or Complexity of Data Reviewed Labs: ordered.  Risk Prescription drug management.   This patient is a 32 y.o. female  who presents to the ED for concern of allergic reaction.   Differential diagnoses prior to evaluation: The emergent differential diagnosis includes, but is not limited to,  mild allergic reaction, anaphylaxis. This is not an exhaustive differential.   Past Medical History / Co-morbidities / Social History: Anemia, current smoker  Additional history: Chart reviewed. Pertinent results include: Allergies to tylenol and aspirin, pt has tolerated ibuprofen in the past  Physical Exam: Physical exam performed. The pertinent findings include: Temperature 100 F orally, otherwise normal vitals. Bilateral 2+ tonsillar swelling without exudate or abscess. No angioedema. No stridor. Urticartial rash on the legs.   Lab Tests/Imaging studies: I personally interpreted labs/imaging and the pertinent results include:  CBC normal. BMP with potassium 3.2, otherwise unremarkable. Negative respiratory panel, negative pregnancy. Group A strep PCR positive.   Medications: I ordered medication including ibuprofen, prednisone, amoxicillin.  I have reviewed the patients home medicines and have made adjustments as needed.   Disposition: After  consideration of the diagnostic results and the patients response to treatment, I feel that emergency department workup does not suggest an emergent condition requiring admission or immediate intervention beyond what has been performed at this time. The plan is: discharge to home with treatment of strep pharyngitis and urticaria of unknown origin. Pt has no known allergies to antibiotics, will give amoxicillin prescription, pt requesting ibuprofen prescription. Itching and hives have improved after benadryl by EMS and steroids in ER. No evidence of anaphylactic reaction. The patient is safe for discharge and has been instructed to return immediately for worsening symptoms, change in symptoms or any other concerns.  Final Clinical Impression(s) / ED Diagnoses Final  diagnoses:  Strep pharyngitis  Urticaria of unknown origin    Rx / DC Orders ED Discharge Orders          Ordered    amoxicillin (AMOXIL) 500 MG capsule  2 times daily        07/31/23 1007    ibuprofen (ADVIL) 800 MG tablet  3 times daily        07/31/23 1009           Portions of this report may have been transcribed using voice recognition software. Every effort was made to ensure accuracy; however, inadvertent computerized transcription errors may be present.    Su Monks, PA-C 07/31/23 1010    Tamberlyn Midgley T, PA-C 07/31/23 1010    Jacalyn Lefevre, MD 07/31/23 1214

## 2023-07-31 NOTE — ED Triage Notes (Addendum)
Patient arrives via [facility] for allergic reaction for 2 days with generalized hives to shoulders and legs. Hives are localized to legs now, hives on shoulder has subsided. Hives had gotten worse after taking benadryl last night. Sick last tow days, strep throat. No anaphylaxis, SOB, N V. Allergic to tylenol and aspirin. 50 mg benadryl PO given by EMS. VSS, in no acute distress. Patient ambulatory to room.

## 2023-07-31 NOTE — Discharge Instructions (Signed)
You were seen in the emergency department today for sore throat and rash.  As we discussed, your strep throat testing was positive. We treat this with antibiotics. Make sure you're taking ibuprofen and/or tylenol as needed for pain or fever. You may return to work with precautions (masking, hand washing) preferably once you are fever free for 24 hours. It is important you finish the entire antibiotic course.  We gave you some steroid medication for your hives. This should continue to help for the next 24-48 hours. You can continue taking benadryl as needed for itching.   Continue to monitor how you're doing and return to the ER for new or worsening symptoms such as fever despite medication, inability to swallow, etc.

## 2023-07-31 NOTE — ED Notes (Signed)
Patient discharged by this RN. Prescriptions reviewed, patient verbalized understanding, no additional questions for this RN. Patient ambulatory upon discharge, IV removed by this RN. Patient leaving with friend and son. In no acute distress.

## 2024-08-16 ENCOUNTER — Emergency Department (HOSPITAL_COMMUNITY)
Admission: EM | Admit: 2024-08-16 | Discharge: 2024-08-17 | Disposition: A | Discharge status: Home / Self Care | Attending: Emergency Medicine | Admitting: Emergency Medicine

## 2024-08-16 ENCOUNTER — Other Ambulatory Visit: Payer: Self-pay

## 2024-08-16 DIAGNOSIS — L509 Urticaria, unspecified: Secondary | ICD-10-CM | POA: Insufficient documentation

## 2024-08-16 MED ORDER — DIPHENHYDRAMINE HCL 50 MG/ML IJ SOLN
25.0000 mg | Freq: Once | INTRAMUSCULAR | Status: AC
  Administered 2024-08-16: 25 mg via INTRAMUSCULAR
  Filled 2024-08-16: qty 1

## 2024-08-16 MED ORDER — FAMOTIDINE 20 MG PO TABS
40.0000 mg | ORAL_TABLET | Freq: Once | ORAL | Status: AC
  Administered 2024-08-16: 40 mg via ORAL
  Filled 2024-08-16: qty 2

## 2024-08-16 MED ORDER — DEXAMETHASONE SODIUM PHOSPHATE 10 MG/ML IJ SOLN
10.0000 mg | Freq: Once | INTRAMUSCULAR | Status: AC
  Administered 2024-08-16: 10 mg via INTRAMUSCULAR
  Filled 2024-08-16: qty 1

## 2024-08-16 NOTE — ED Provider Triage Note (Signed)
 Emergency Medicine Provider Triage Evaluation Note  Sherry Woodward , a 33 y.o. female  was evaluated in triage.  Pt complains of allergic reaction. Onset after taking Zyrtec on Friday. Reports intermittent hives which will improve a bit with Benadryl , but developed swelling of the L upper lip at 2300 tonight. Took 25mg  Benadryl  PTA. No SOB, tongue swelling, difficulty swallowing, N/V. Denies other ingestions. 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  Review of Systems  Positive: As above Negative: As above  Physical Exam  BP (!) 139/91   Pulse 74   Temp 97.7 F (36.5 C)   Resp 16   Ht 5' 11 (1.803 m)   Wt 62.6 kg   SpO2 98%   BMI 19.25 kg/m  Gen:   Awake, no distress   Resp:  Normal effort  MSK:   Moves extremities without difficulty  Other:  Swelling of L upper lip. No posterior oropharyngeal edema. Normal phonation. Tolerating secretions.  Medical Decision Making  Medically screening exam initiated at 11:48 PM.  Appropriate orders placed.  Sherry Alexcia Schools was informed that the remainder of the evaluation will be completed by another provider, this initial triage assessment does not replace that evaluation, and the importance of remaining in the ED until their evaluation is complete.  Allergic rxn - medications ordered pending room placement. Stable at this time without concern for acute anaphylaxis.    Keith Sor, PA-C 08/16/24 2350

## 2024-08-16 NOTE — ED Triage Notes (Signed)
 Hives since Friday and lip swelling x 30 minutes Denies airway issues. No tongue swelling. No voice changes.  Unsure what she's allergic too.  Took a benadryl  prior to arrival but doesn't want anymore.

## 2024-08-17 MED ORDER — DIPHENHYDRAMINE HCL 25 MG PO CAPS
25.0000 mg | ORAL_CAPSULE | Freq: Once | ORAL | Status: AC
  Administered 2024-08-17: 25 mg via ORAL
  Filled 2024-08-17: qty 1

## 2024-08-17 MED ORDER — DOXEPIN HCL 10 MG PO CAPS: 10.0000 mg | ORAL_CAPSULE | Freq: Three times a day (TID) | ORAL | 0 refills | Status: AC

## 2024-08-17 MED ORDER — PREDNISONE 50 MG PO TABS: 50.0000 mg | ORAL_TABLET | Freq: Every day | ORAL | 0 refills | Status: AC

## 2024-08-17 MED ORDER — PREDNISONE 20 MG PO TABS
40.0000 mg | ORAL_TABLET | Freq: Once | ORAL | Status: AC
  Administered 2024-08-17: 40 mg via ORAL
  Filled 2024-08-17: qty 2

## 2024-08-17 NOTE — ED Notes (Signed)
 Pt continues to have lip swelling/hives/itching. No change in airway status.

## 2024-08-17 NOTE — Discharge Instructions (Addendum)
 You may take diphenhydramine  as needed for your hives.  If that is not sufficient to control the hives, you may add famotidine .  Please follow-up with the allergist for further evaluation and to try to identify what your specific triggers are.  Return if the swelling in your lip is getting worse, or if you develop difficulty breathing or swallowing.

## 2024-08-17 NOTE — ED Provider Notes (Signed)
 [place] EMERGENCY DEPARTMENT AT [facility] Provider Note   CSN: [id number] Arrival date & time: 08/16/24  2336     Patient presents with: Allergic Reaction   Sherry Woodward is a 33 y.o. female.   The history is provided by the patient.  Allergic Reaction   She has history of hives and comes in complaining of hives and swelling of her lip.  She started having hives in her right thigh 4 days ago.  She took a dose of cetirizine and diphenhydramine .  Hives did improve following diphenhydramine , but did recur.  Tonight, she developed some swelling of her lip.  This has happened in the past as well.  She denies any difficulty breathing or swallowing.  At triage, she was given a dose of dexamethasone , diphenhydramine , famotidine .  She is concerned that she has allergies to over-the-counter medications.  She had been seen by an allergist when a child, but has not seen one as an adult.  She states that the hives always occur on her right thigh and buttock.    Prior to Admission medications   Medication Sig Start Date End Date Taking? Authorizing Provider  coconut oil OIL Apply 1 application topically as needed (nipple pain). 12/01/20   Myrl Therisa BRAVO, MD  diphenhydrAMINE  (BENADRYL ) 25 MG tablet Take 25 mg by mouth daily as needed for allergies.    [provider]  docusate sodium  (COLACE) 100 MG capsule Take 100 mg by mouth daily as needed for mild constipation.    [provider]  ibuprofen  (ADVIL ) 800 MG tablet Take 1 tablet (800 mg total) by mouth 3 (three) times daily. 07/31/23   Roemhildt, Lorin T, PA-C  loratadine  (CLARITIN ) 10 MG tablet Take 1 tablet (10 mg total) by mouth daily as needed for allergies or itching. 11/26/20 11/26/21  Stinson, Jacob J, DO  methocarbamol  (ROBAXIN ) 500 MG tablet Take 1 tablet (500 mg total) by mouth every 8 (eight) hours as needed for muscle spasms. 07/01/22   Pickering, Nathan, MD  polyethylene glycol (MIRALAX  / GLYCOLAX ) 17  g packet Take 17 g by mouth daily as needed for mild constipation or moderate constipation. 12/01/20   Myrl Therisa BRAVO, MD  Prenatal Vit-Fe Fumarate-FA (MULTIVITAMIN-PRENATAL) 27-0.8 MG TABS tablet Take 1 tablet by mouth daily at 12 noon.    [provider]    Allergies: Aspirin and Tylenol  [acetaminophen ]    Review of Systems  All other systems reviewed and are negative.   Updated Vital Signs BP (!) 139/91   Pulse 74   Temp 97.7 F (36.5 C)   Resp 16   Ht 5' 11 (1.803 m)   Wt 62.6 kg   SpO2 98%   BMI 19.25 kg/m   Physical Exam Vitals and nursing note reviewed.   33 year old female, resting comfortably and in no acute distress. Vital signs are significant for borderline elevated blood pressure. Oxygen saturation is 98%, which is normal. Head is normocephalic and atraumatic. PERRLA, EOMI.  There is swelling of the left side of the upper lip in a pattern suggestive of angioedema.  There is no swelling of the tongue or sublingual tissue, minimal edema of the uvula.  There is no pooling of secretions and phonation is normal. Neck is nontender and supple without adenopathy.  There is no stridor. Lungs are clear without rales, wheezes, or rhonchi. Chest is nontender. Heart has regular rate and rhythm without murmur. Abdomen is soft, flat, nontender. Skin is warm and dry.  No rashes seen currently, but I do see some excoriations from scratching.  Patient did show me a picture of what her thigh looks like earlier today and there were clear urticarial lesions. Neurologic: Mental status is normal, cranial nerves are intact, moves all extremities equally.    Procedures   Medications Ordered in the ED  diphenhydrAMINE  (BENADRYL ) injection 25 mg (25 mg Intramuscular Given 08/16/24 2354)  dexamethasone  (DECADRON ) injection 10 mg (10 mg Intramuscular Given 08/16/24 2354)  famotidine  (PEPCID ) tablet 40 mg (40 mg Oral Given 08/16/24 2352)  predniSONE  (DELTASONE ) tablet 40 mg (40 mg Oral  Given 08/17/24 0254)  diphenhydrAMINE  (BENADRYL ) capsule 25 mg (25 mg Oral Given 08/17/24 0323)                                    Medical Decision Making Risk Prescription drug management.   Urticaria with lip swelling concerning for angioedema.  Patient does relate repeated episodes of urticaria and lip swelling.  She would clearly benefit from evaluation by an allergist.  I have ordered an additional dose of diphenhydramine  and prednisone .  Patient has been resting comfortably in the ED.  On reevaluation, it seems like the swelling in her lip is slightly decreased, no recurrence of urticaria.  I am discharging her with prescriptions for prednisone  and doxepin .  I am referring her to allergy for further outpatient workup.  Return precautions have been discussed.     Final diagnoses:  Urticaria    ED Discharge Orders          Ordered    predniSONE  (DELTASONE ) 50 MG tablet  Daily        08/17/24 0531    doxepin  (SINEQUAN ) 10 MG capsule  3 times daily        08/17/24 0531               Raford Lenis, MD 08/17/24 [phone]

## 2024-08-17 NOTE — ED Notes (Signed)
 Pt continues to have a swollen lip and hives. Hasn't really gone down but continues with no airway issues. No drooling. No change in voice and no trouble swallowing.
